# Patient Record
Sex: Female | Born: 1980 | Race: White | Hispanic: No | Marital: Married | Smoking: Never smoker
Health system: Southern US, Community
[De-identification: ages and names within clinical notes are randomized; demographics above are authoritative.]

## PROBLEM LIST (undated history)

## (undated) DIAGNOSIS — R002 Palpitations: Principal | ICD-10-CM

## (undated) HISTORY — PX: CHOLECYSTECTOMY: SHX55

## (undated) HISTORY — DX: Palpitations: R00.2

---

## 2008-01-30 ENCOUNTER — Encounter: Admission: RE | Admit: 2008-01-30 | Discharge: 2008-01-30 | Payer: Self-pay | Admitting: Family Medicine

## 2008-02-04 ENCOUNTER — Ambulatory Visit (HOSPITAL_COMMUNITY): Admission: RE | Admit: 2008-02-04 | Discharge: 2008-02-04 | Payer: Self-pay | Admitting: Gastroenterology

## 2008-02-06 ENCOUNTER — Observation Stay (HOSPITAL_COMMUNITY): Admission: EM | Admit: 2008-02-06 | Discharge: 2008-02-08 | Payer: Self-pay | Admitting: Emergency Medicine

## 2008-03-05 ENCOUNTER — Ambulatory Visit (HOSPITAL_COMMUNITY): Admission: RE | Admit: 2008-03-05 | Discharge: 2008-03-05 | Payer: Self-pay | Admitting: General Surgery

## 2008-03-05 ENCOUNTER — Encounter (HOSPITAL_BASED_OUTPATIENT_CLINIC_OR_DEPARTMENT_OTHER): Payer: Self-pay | Admitting: General Surgery

## 2011-01-24 NOTE — Op Note (Signed)
NAMEJEMIMAH, Peggy Medina             ACCOUNT NO.:  [id number]   MEDICAL RECORD NO.:  [id number]          PATIENT TYPE:  INP   LOCATION:  3737                         FACILITY:  [facility]   PHYSICIAN:  Bernette Redbird, M.D.   DATE OF BIRTH:  22-Aug-1981   DATE OF PROCEDURE:  02/06/2008  DATE OF DISCHARGE:                               OPERATIVE REPORT   PROCEDURE:  Upper endoscopy with control of hemorrhage.   INDICATION:  This is a 30 year old female 2 days status post uneventful  sphincterotomy with removal of common duct stone, done in anticipation  of upcoming laparoscopic cholecystectomy.  The patient had elevated  liver chemistries preoperatively, and they have improved substantially  today.   The patient felt a lot better after a sphincterotomy and did well until  today when she started having hematochezia and orthostatic symptoms.  In  the emergency room, her hemoglobin was approximately 10.3 and has fallen  to 9.3 with hydration, and she passed out walking back from the  bathroom.   FINDINGS:  Oozing from sphincterotomy site with complete hemostasis  after epinephrine injection.   PROCEDURE:  The patient provided written consent for the procedure.  Sedation with Versed 15 mg IV (no fentanyl, in view of borderline blood  pressure and orthostasis).  The Pentax video duodenoscope was passed  bluntly into the esophagus and advanced into the stomach, which had a  grossly normal appearance in all areas.  No blood or coffee-ground  material were present in the stomach.   The duodenum was entered without too much difficulty.  It was noted to  contain a fair amount of amber bile.   I went back-and-forth attempting to find the sphincterotomy site, which  was not immediately apparent and during pullback, I saw a little bit of  blood trickling from the duodenal wall, and on closer inspection, this  corresponded to the sphincterotomy site as confirmed by a small amount  of eschar present  at the apex of the cut.  With rinsing, it appeared  that blood was coming both from the margin of the cut and also from the  base of the cut on its inferior margin.  A sclerotherapy needle with  1:10,000 epinephrine was used to inject this area with a total of 2 mL  in approximately 3 injections.  This led to good blanching of the  mucosa.   It should be noted the patient also received glucagon 1 mg IV during the  procedure to reduce duodenal contractility.   Rinsing with water disclosed no evidence of ongoing bleeding, so the  scope was removed from the patient, who tolerated the procedure well,  and there were no apparent complications.   IMPRESSION:  1. Active oozing at the sphincterotomy site, controlled with      epinephrine injection as described above.  2. Hematochezia with posthemorrhagic anemia and mild-to-moderate      hemodynamic instability, secondary to active oozing at the      sphincterotomy site.   PLAN:  Supportive care with close observation and empiric antipeptic  therapy.  The patient might be a candidate for repeat  endoscopy in the  event of further bleeding.           ______________________________  Bernette Redbird, M.D.     RB/MEDQ  D:  02/06/2008  T:  02/07/2008  Job:  [id number]   cc:   Gretta Arab. Valentina Lucks, M.D.  [facility]

## 2011-01-24 NOTE — Op Note (Signed)
Peggy Medina, Peggy Medina             ACCOUNT NO.:  [id number]   MEDICAL RECORD NO.:  [id number]          PATIENT TYPE:  AMB   LOCATION:  ENDO                         FACILITY:  [facility]   PHYSICIAN:  Petra Kuba, M.D.    DATE OF BIRTH:  Jul 29, 1981   DATE OF PROCEDURE:  02/04/2008  DATE OF DISCHARGE:                               OPERATIVE REPORT   PROCEDURES:  Endoscopic retrograde cholangiopancreatography,  sphincterotomy and stone extraction.   INDICATION:  The patient with probable CBD stones, pain, dilated CBD,  elevated liver tests, and known gallstones.  Consent was signed after  risks, benefits, methods, and options thoroughly discussed in the office  last week and today prior to procedure.   MEDICINES USED:  1. Fentanyl 150 mcg.  2. Versed 10 mg.   PROCEDURE:  Side-viewing therapeutic video duodenoscope was inserted by  indirect vision into the stomach and advanced through normal antrum and  pylorus into the duodenal bulb and around the C-loop.  A normal-  appearing ampulla was brought into view and using the triple-lumen  sphincterotome, deep selective cannulation was obtained using the wire.  There were no PD injections throughout the procedure.  We had difficulty  keeping dye in the CBD.  The intrahepatic filled readily, left greater  than right.  We went ahead and proceeded based on  thinking we saw a  stone on some of the injections floating freely in the CBD with a medium-  sized sphincterotomy until we had excellent biliary drainage and we were  able to get completely the sphincterotome in and out of the duct.  We  then went ahead and exchanged the sphincterotome for the adjustable 12-  to 15-mm balloon.  Upon the initial pull-through, there was no  resistance.  The stone seemed to slide around the balloon, so we went  ahead and inflated the balloon to 15 mm prior to the duct, lowered the  balloon to 12 mm, and the balloon and the stone were delivered.  We went  ahead and proceeded with two more balloon pull-throughs, one with  lowering the balloon as we did before and the other with pulling the  balloon through 15 mm size with only minimal resistance.  We then  proceeded with an occlusion cholangiogram which showed normal  intrahepatic but again had trouble keeping contrast in the CBD.  There  seemed to still be too much air in the system.  There was no cystic duct  filling throughout the procedure and withdrawing the balloon.  After the  occlusion cholangiogram, we did when popping the balloon and withdrawing  it one more time to 15 mm mark and elected to stop the procedure at this  junction since there was excellent biliary drainage and no obvious stone  being seen.  The scope was removed.  The patient tolerated the procedure  well.  There was no obvious immediate complication.   ENDOSCOPIC DIAGNOSES:  1. Normal ampulla.  2. No PD injections throughout the procedure.  No cystic duct filling      or gallbladder filling.  3. Dilated intrahepatic.  4. Difficult to  keep dye in the common bile duct.  5. One stone removed after moderate sphincterotomy using the      adjustable 12- to 15-mm balloon.  6. Negative obvious occlusion cholangiogram but not great distal      common bile duct pictures.  7. Multiple balloon pull-throughs without any further stones.   PLAN:  To see how she does clinically.  We will get a surgical consult  fairly soon.  No aspirin or nonsteroidals.  We will need repeat liver  tests in a few days probably when she will see the surgeon.  Very slowly  advance her diet and call p.r.n. recurrent symptoms or complications.           ______________________________  Petra Kuba, M.D.     MEM/MEDQ  D:  02/04/2008  T:  02/05/2008  Job:  [id number]   cc:   Gretta Arab. Valentina Lucks, M.D.

## 2011-01-24 NOTE — Op Note (Signed)
NAMEMELONEY, Peggy Medina             ACCOUNT NO.:  [id number]   MEDICAL RECORD NO.:  [id number]          PATIENT TYPE:  AMB   LOCATION:  SDS                          FACILITY:  [facility]   PHYSICIAN:  Leonie Man, M.D.   DATE OF BIRTH:  03-27-1981   DATE OF PROCEDURE:  03/05/2008  DATE OF DISCHARGE:  03/05/2008                               OPERATIVE REPORT   PREOPERATIVE DIAGNOSIS:  Chronic calculous cholecystitis.   POSTOPERATIVE DIAGNOSIS:  Chronic calculous cholecystitis.   PROCEDURE:  Laparoscopic cholecystectomy with intraoperative  cholangiogram.   SURGEON:  Dorisann Frames, MD   ASSISTANT:  Jerkin.   ANESTHESIA:  General.   SPECIMENS TO LAB:  Gallbladder with stones.   FINDINGS:  Chronically scarred gallbladder, normal common bile duct,  hepatic duct, and upper hepatic radicals.   The patient is a 30 year old female presenting originally with severe  epigastric and right upper quadrant pain, on ultrasound noted to have  choledocholithiasis.  She also had an elevated bilirubin and elevated  liver function studies and she underwent ERCP.  She subsequently now  comes to operation for laparoscopic cholecystectomy and intraoperative  cholangiogram.   The patient was positioned supinely and following induction of  satisfactory general anesthesia, the abdomen is prepped and draped to be  included in a sterile operative field.  Positive identification of the  patient as Peggy Medina and the operation will be done as  laparoscopic cholecystectomy carried out.  Routine time-out was  completely unremarkable.   Open laparoscopy was then created at the umbilicus with insertion of a  Hasson cannula and insufflation of the peritoneal cavity to 14 mmHg  pressure using carbon dioxide.  The camera was inserted and visual  exploration of the abdomen then carried out with the gallbladder now  being seem to be quite chronically scarred with a few adhesions of the  wall of the  gallbladder.  Gallbladder wall was thickened.  The liver  edges were sharp and liver surfaces were smooth.  None of the small or  large intestine was visualized, appeared to be abnormal.   Under direct vision, epigastric and lateral ports were placed.  The  gallbladder was grasped and retracted cephalad, and dissection carried  down to the region of the ampulla where the cystic duct was mobilized.  The cystic duct was somewhat thickened with scar and this was  skeletonized down through a still a large cystic duct.  Cystic artery  was seen, doubly clipped and transected.  The cystic duct cholangiogram  was carried out by passing a Cook catheter transcutaneously into the  abdomen and the cystic duct.  Under fluoroscopic control, a one-half  strength Hypaque was injected into the extrahepatic biliary system with  resulting cholangiogram showing a normal hepatic ducts  and normal  common bile duct shows a prompt flow of contrast into the duodenum.   The cystic duct was then triply clipped and transection and the  gallbladder was dissected free from the liver bed using electrocautery  and hemostasis was maintained throughout the entire course of  dissection.  At the end of dissection, the gallbladder was placed  in an  EndoCatch and removed from the operative field without difficulty.  All  the areas of dissection checked for hemostasis and noted to be dry.  Sponge and instrument counts were verified.  The lateral ports were  removed under direct vision and the umbilical port closed under direct  vision in two layers using 0-Vicryl and 4-0 Monocryl.  Pneumoperitoneum  was allowed to deflate and the flank and epigastric wounds closed with  running 4-0 Monocryl sutures and reinforced with Dermabond.  Anesthetic  reversed.  The patient was removed from the operating room to the  recovery room in stable condition.  She tolerated the procedure well.      Leonie Man, M.D.  Electronically  Signed     PB/MEDQ  D:  03/05/2008  T:  03/06/2008  Job:  [id number]

## 2011-01-27 NOTE — H&P (Signed)
NAMEGILA, Peggy Medina             ACCOUNT NO.:  [id number]   MEDICAL RECORD NO.:  [id number]          PATIENT TYPE:  INP   LOCATION:  3737                         FACILITY:  [facility]   PHYSICIAN:  Bernette Redbird, M.D.   DATE OF BIRTH:  September 16, 1980   DATE OF ADMISSION:  02/06/2008  DATE OF DISCHARGE:  02/08/2008                              HISTORY & PHYSICAL   HISTORY OF PRESENT ILLNESS:  Peggy Medina is a 30 year old female who on  Feb 04, 2008, underwent ERCP with Dr. Vida Rigger for  choledocholithiasis.  The patient felt fine, yesterday being Feb 05, 2008.  She saw Dr. Leonie Man  with [facility] for an  office visit and scheduled a laparoscopic cholecystectomy in the coming  weeks.  Afterwards, she began having dark bowel movements overnight and  first thing in the morning on Feb 06, 2008, she had 2 more dark bowel  movements, the liquid in the last bowel movement was tinged with red  blood.  The patient describes passing out on the bathroom floor.  She is  dizzy when standing.  She denies any abdominal pain, vomiting, or fever.  Her primary care physician is Dr.Elaine Valentina Lucks.   PAST MEDICAL HISTORY:  Significant for no chronic medical illnesses.  She has a history of benign lumpectomy.  Possible history of ulcer  disease at age 23.   CURRENT MEDICATIONS:  None.   ALLERGIES TO MEDICATIONS:  None.   REVIEW OF SYSTEMS:  Significant for dark urine and abdominal pain, which  were both relieved after her ERCP on Feb 04, 2008.   SOCIAL HISTORY:  Significant for occasional alcohol but no tobacco.  She  lives with her boyfriend who is very supportive.   FAMILY HISTORY:  Negative for gallbladder disease.   PHYSICAL EXAMINATION:  GENERAL:  She is alert and oriented.  Shows no  signs of jaundice.  VITAL SIGNS:  Her temperature is 97.7, pulse is 118, respirations are  20.  HEART:  Regular rate and rhythm.  LUNGS:  Clear to auscultation bilaterally.  ABDOMEN:  Soft,  nontender, and nondistended with good bowel sounds.  NEUROLOGIC:  She does appear to get very dizzy when she sits up or  stands up.   LABORATORY DATA:  Labs on Feb 04, 2008, prior to her ERCP showed AST of  449, ALT of 670, alk phos 230, and total bilirubin 6.3.  Hemoglobin in  the emergency room today is 10.4, hematocrit is 30.7, White count 9.5,  and platelets 232,000.  Her BUN is 26, creatinine 0.8, and glucose 154.  An ultrasound done on Jan 30, 2008, showed,  1. Obstructive biliary tract with choledocholithiasis.  2. Gallbladder sludge.  3. No gallbladder wall thickening.   ASSESSMENT:  Dr. Molly Maduro Buccini has seen and examined the patient's  history and reviewed her chart.  He reports the patient denies pain.  LFTs significantly improved.  Hemoglobin is dropping and have started  second IV and prepared to give blood, especially of EGD shows active  bleeding, which cannot be controlled.      Stephani Police, PA  ______________________________  Bernette Redbird, M.D.    MLY/MEDQ  D:  02/10/2008  T:  02/11/2008  Job:  [id number]   cc:   Gretta Arab. Valentina Lucks, M.D.

## 2011-01-27 NOTE — Discharge Summary (Signed)
Peggy Medina, Peggy Medina             ACCOUNT NO.:  [id number]   MEDICAL RECORD NO.:  [id number]          PATIENT TYPE:  INP   LOCATION:  3737                         FACILITY:  [facility]   PHYSICIAN:  Stephani Police, PA    DATE OF BIRTH:  1981/02/09   DATE OF ADMISSION:  02/06/2008  DATE OF DISCHARGE:  02/08/2008                               DISCHARGE SUMMARY   DISCHARGE DIAGNOSES:  1. Gastrointestinal bleed status post endoscopic retrograde      cholangiopancreatography with sphincterotomy.  2. Acute blood loss anemia.  3. Cholelithiasis.   PROCEDURES:  Upper endoscopy on Feb 06, 2008, by Dr. Bernette Medina, the  result of which showed active oozing at the sphincterotomy site.  Bleeding was controlled with epinephrine injection.   CONSULTANTS:  None.   RADIOLOGICAL EXAMS:  None   BRIEF HISTORY AND PHYSICAL:  This is a very pleasant 30 year old female  who was recently diagnosed with choledocholithiasis, but became  symptomatic.  Her LFTs became elevated.  She was very jaundiced and had  dark urine.  On Feb 04, 2008, Dr. Petra Medina did a successful ERCP  with sphincterotomy and stone removal.  The patient tells me that she  felt immediately better on Feb 05, 2008, and her jaundice decreased.  Her urine was no longer dark and she was not having any abdominal pain.  She came to the hospital on Feb 06, 2008, after having multiple black  bowel movements and feeling extremely dizzy.  She states that she passed  out on the bathroom floor.  On my initial exam, she was alert and  oriented, slightly pale.  Her abdomen was soft, nontender, and  nondistended with good bowel sounds.  When she sat up, she did become  dizzy.  Orthostatics were positive.  It was decided that she needed an  upper endoscopy as well as admission for observation.   HOSPITAL COURSE:  The patient was given a 1000 mL bolus of normal saline  fluids.  She underwent upper endoscopy by Dr. Bernette Medina.  Results  were listed above.  She was admitted under observation status and given  2 units of packed red blood cells on Feb 07, 2008.  Dr. Matthias Medina noted  that she had was hemodynamically stable.  She had no further dizziness.  On Feb 08, 2008, she was ready for discharge.  Her hemoglobin was stable  at 9.3.  She was able to tolerate her diet.  She was able to ambulate  around the room without any dizziness.   LABS AT DISCHARGE:  Significant for a BMET that was within normal  limits, specifically a BUN of 2 and creatinine 0.76 and a CBC that  showed a hemoglobin of 9.3, hematocrit 29.5, white count 6.0, and  platelets 164,000.  As a note of followup to the elevated LFTs from her  choledocholithiasis, her LFTs on Feb 07, 2008, were AST 248, ALT 455,  alkaline phosphatase 108, total bilirubin 1.3.  the patient was  discharged to home in good condition.   DISCHARGE MEDICATIONS:  Included:  1. Tylenol 325 mg q.8 h. p.r.n. pain.  2. Phenergan 25 mg p.o. q.8 h. p.r.n. nausea.   FOLLOWUP INSTRUCTIONS:  Included:  1. No NSAIDs or aspirin.  2. Please present to the [facility] GI office on February 10, 2008, for lab work      inclusive of CBC.  Also, she was given a followup appointment with      Dr. Vida Medina in 1-2 weeks.  Also, she had a previous scheduled      appointment with her primary care physician, Dr. Maurice Medina,      which she will keep.      Stephani Police, PA     MLY/MEDQ  D:  02/10/2008  T:  02/11/2008  Job:  [id number]   cc:   Peggy Medina, M.D.  Peggy Medina, M.D.  Peggy Medina, M.D.

## 2011-06-07 LAB — CBC
HCT: 20.2 — ABNORMAL LOW
HCT: 30 — ABNORMAL LOW
HCT: 30.7 — ABNORMAL LOW
Hemoglobin: 10.1 — ABNORMAL LOW
Hemoglobin: 10.6 — ABNORMAL LOW
Hemoglobin: 7 — CL
Hemoglobin: 8.4 — ABNORMAL LOW
Hemoglobin: 9.3 — ABNORMAL LOW
MCHC: 31.5
MCHC: 33.8
MCHC: 34.2
MCHC: 34.2
MCV: 84.9
MCV: 85.1
MCV: 85.3
MCV: 86.5
MCV: 86.6
MCV: 87.7
Platelets: 155
Platelets: 163
Platelets: 232
RBC: 2.87 — ABNORMAL LOW
RBC: 3.2 — ABNORMAL LOW
RBC: 3.52 — ABNORMAL LOW
RDW: 12.8
RDW: 13.1
RDW: 13.6
RDW: 14.1
WBC: 10.7 — ABNORMAL HIGH
WBC: 6.6
WBC: 7.2
WBC: 9.5

## 2011-06-07 LAB — HEPATIC FUNCTION PANEL
ALT: 670 — ABNORMAL HIGH
AST: 449 — ABNORMAL HIGH
Albumin: 4
Alkaline Phosphatase: 230 — ABNORMAL HIGH
Total Bilirubin: 6.3 — ABNORMAL HIGH

## 2011-06-07 LAB — COMPREHENSIVE METABOLIC PANEL
ALT: 455 — ABNORMAL HIGH
Albumin: 3.3 — ABNORMAL LOW
BUN: 23
Calcium: 8.3 — ABNORMAL LOW
Chloride: 106
Creatinine, Ser: 0.67
Creatinine, Ser: 0.83
GFR calc non Af Amer: >60
Glucose, Bld: 108 — ABNORMAL HIGH
Sodium: 140
Total Bilirubin: 1
Total Protein: 5.1 — ABNORMAL LOW

## 2011-06-07 LAB — POCT I-STAT, CHEM 8
HCT: 32 — ABNORMAL LOW
Hemoglobin: 10.9 — ABNORMAL LOW
Potassium: 4.9
Sodium: 138

## 2011-06-07 LAB — DIFFERENTIAL
Basophils Absolute: 0
Lymphocytes Relative: 11 — ABNORMAL LOW
Monocytes Absolute: 0.5
Neutro Abs: 7.9 — ABNORMAL HIGH

## 2011-06-07 LAB — TYPE AND SCREEN: ABO/RH(D): O POS

## 2011-06-07 LAB — BASIC METABOLIC PANEL
BUN: 2 — ABNORMAL LOW
BUN: 2 — ABNORMAL LOW
CO2: 23
CO2: 26
Calcium: 8.7
Chloride: 109
Chloride: 110
Creatinine, Ser: 0.75
Creatinine, Ser: 0.76
Glucose, Bld: 110 — ABNORMAL HIGH
Glucose, Bld: 91
Potassium: 3.2 — ABNORMAL LOW

## 2011-06-08 LAB — CBC
MCHC: 35
MCV: 84.9
Platelets: 223
RBC: 4.08
WBC: 5.4

## 2017-11-06 ENCOUNTER — Encounter (HOSPITAL_COMMUNITY): Payer: Self-pay | Admitting: Emergency Medicine

## 2017-11-06 ENCOUNTER — Other Ambulatory Visit: Payer: Self-pay

## 2017-11-06 ENCOUNTER — Ambulatory Visit (HOSPITAL_COMMUNITY)
Admission: EM | Admit: 2017-11-06 | Discharge: 2017-11-06 | Disposition: A | Discharge status: Home / Self Care | Payer: Managed Care, Other (non HMO) | Attending: Family Medicine | Admitting: Family Medicine

## 2017-11-06 DIAGNOSIS — L309 Dermatitis, unspecified: Secondary | ICD-10-CM

## 2017-11-06 MED ORDER — PREDNISONE 10 MG (48) PO TBPK: ORAL_TABLET | ORAL | 0 refills | Status: DC

## 2017-11-06 NOTE — ED Triage Notes (Addendum)
Pt reports a rash on her forearms, bilaterally that started last Thursday.  She also reports what looks like bug bites on the front of her legs bilaterally.

## 2017-11-06 NOTE — ED Provider Notes (Signed)
  [facility]   [id number] 11/06/17 Arrival Time: 1013  ASSESSMENT & PLAN:  1. Dermatitis     Meds ordered this encounter  Medications  . predniSONE (STERAPRED UNI-PAK 48 TAB) 10 MG (48) TBPK tablet    Sig: Take as directed.    Dispense:  48 tablet    Refill:  0   Benadryl if needed. Will f/u if not seeing significant improvement over the next 24-48 hours.  Reviewed expectations re: course of current medical issues. Questions answered. Outlined signs and symptoms indicating need for more acute intervention. Patient verbalized understanding. After Visit Summary given.   SUBJECTIVE:  Peggy Medina is a 37 y.o. female who presents with complaint of:   Rash Patient presents for evaluation of a rash involving her upper extremities. Fairly abrupt onset 5-6 days ago. Much itching. Reports h/o "sensitive skin." No new exposures. No trigger identified. OTC cream without relief. Afebrile. Feels itching is getting worse. Hot shower exacerbates.  ROS: As per HPI.  OBJECTIVE: Vitals:   11/06/17 1057  BP: (!) 144/92  Pulse: 77  Temp: 98.7 F (37.1 C)  TempSrc: Oral  SpO2: 96%    General appearance: alert; no distress Lungs: clear to auscultation bilaterally Heart: regular rate and rhythm Extremities: no edema Skin: warm and dry; bilateral flexor surface skin erythema with linear and confluent areas; few areas of confluent vesicles; overall appears rhus-like Psychological: alert and cooperative; normal mood and affect  No Known Allergies   Social History   Socioeconomic History  . Marital status: Single    Spouse name: Not on file  . Number of children: Not on file  . Years of education: Not on file  . Highest education level: Not on file  Social Needs  . Financial resource strain: Not on file  . Food insecurity - worry: Not on file  . Food insecurity - inability: Not on file  . Transportation needs - medical: Not on file  . Transportation needs -  non-medical: Not on file  Occupational History  . Not on file  Tobacco Use  . Smoking status: Never Smoker  . Smokeless tobacco: Never Used  Substance and Sexual Activity  . Alcohol use: Yes  . Drug use: No  . Sexual activity: Not on file  Other Topics Concern  . Not on file  Social History Narrative  . Not on file    Past Surgical History:  Procedure Laterality Date  . Barrington EllisonHOLECYSTECTOMY       Jacoya Bauman, MD 11/06/17 1302

## 2017-11-28 ENCOUNTER — Other Ambulatory Visit: Payer: Self-pay

## 2017-11-28 ENCOUNTER — Ambulatory Visit (HOSPITAL_COMMUNITY)
Admission: EM | Admit: 2017-11-28 | Discharge: 2017-11-28 | Disposition: A | Discharge status: Home / Self Care | Payer: Managed Care, Other (non HMO) | Attending: Family Medicine | Admitting: Family Medicine

## 2017-11-28 ENCOUNTER — Encounter (HOSPITAL_COMMUNITY): Payer: Self-pay | Admitting: Emergency Medicine

## 2017-11-28 DIAGNOSIS — L309 Dermatitis, unspecified: Secondary | ICD-10-CM | POA: Diagnosis not present

## 2017-11-28 MED ORDER — BETAMETHASONE DIPROPIONATE 0.05 % EX OINT: TOPICAL_OINTMENT | CUTANEOUS | 0 refills | Status: AC

## 2017-11-28 MED ORDER — TRIAMCINOLONE ACETONIDE 0.1 % EX CREA: 1.0000 "application " | TOPICAL_CREAM | Freq: Two times a day (BID) | CUTANEOUS | 0 refills | Status: DC

## 2017-11-28 MED ORDER — PREDNISONE 10 MG (21) PO TBPK: ORAL_TABLET | Freq: Every day | ORAL | 0 refills | Status: DC

## 2017-11-28 NOTE — ED Triage Notes (Signed)
Pt. Stated, Peggy Medina had a rash for a week, started on my arms and has spread all over.

## 2017-12-03 NOTE — ED Provider Notes (Signed)
[facility]   [id number] 11/28/17 Arrival Time: 1740  ASSESSMENT & PLAN:  1. Eczema, unspecified type     Meds ordered this encounter  Medications  . predniSONE (STERAPRED UNI-PAK 21 TAB) 10 MG (21) TBPK tablet    Sig: Take by mouth daily. Take as directed.    Dispense:  21 tablet    Refill:  0  . betamethasone dipropionate (DIPROLENE) 0.05 % ointment    Sig: Apply twice daily to hand eczema.    Dispense:  30 g    Refill:  0  . triamcinolone cream (KENALOG) 0.1 %    Sig: Apply 1 application topically 2 (two) times daily.    Dispense:  30 g    Refill:  0   Will think about scheduling dermatology appt if needed. Will follow up with PCP or here if worsening or failing to improve as anticipated. Reviewed expectations re: course of current medical issues. Questions answered. Outlined signs and symptoms indicating need for more acute intervention. Patient verbalized understanding. After Visit Summary given.   SUBJECTIVE:  Peggy Medina is a 37 y.o. female who presents with a skin complaint.   Location: forearms; "same as last visit"; resolved completely after taking prednisone Onset: gradual Duration: 1 week Pruritic? Yes Painful? Yes Progression: increasing steadily  Drainage? No  Known trigger? No  New soaps/lotions/topicals/detergents? No Environmental exposures or allergies? none Contacts with similar? No Recent travel? No  Other associated symptoms: none Therapies tried thus far: OTC hydrocortisone without relief Denies fever. No specific aggravating or alleviating factors reported.  ROS: As per HPI.  OBJECTIVE: Vitals:   11/28/17 1902 11/28/17 1904  BP: 132/88   Pulse: 79   Resp: 17   Temp: 98.4 F (36.9 C)   TempSrc: Oral   SpO2: 100%   Weight:  154 lb (69.9 kg)  Height:  5\' 8"  (1.727 m)    General appearance: alert; no distress Lungs: clear to auscultation bilaterally Heart: regular rate and rhythm Extremities: no edema Skin:  warm and dry; bilateral flexor surface skin erythema with linear and confluent areas; few areas of confluent vesicles; appears the same as on her last visit Psychological: alert and cooperative; normal mood and affect  No Known Allergies   Social History   Socioeconomic History  . Marital status: Single    Spouse name: Not on file  . Number of children: Not on file  . Years of education: Not on file  . Highest education level: Not on file  Occupational History  . Not on file  Social Needs  . Financial resource strain: Not on file  . Food insecurity:    Worry: Not on file    Inability: Not on file  . Transportation needs:    Medical: Not on file    Non-medical: Not on file  Tobacco Use  . Smoking status: Never Smoker  . Smokeless tobacco: Never Used  Substance and Sexual Activity  . Alcohol use: Yes  . Drug use: No  . Sexual activity: Not on file  Lifestyle  . Physical activity:    Days per week: Not on file    Minutes per session: Not on file  . Stress: Not on file  Relationships  . Social connections:    Talks on phone: Not on file    Gets together: Not on file    Attends religious service: Not on file    Active member of club or organization: Not on file    Attends meetings of  clubs or organizations: Not on file    Relationship status: Not on file  . Intimate partner violence:    Fear of current or ex partner: Not on file    Emotionally abused: Not on file    Physically abused: Not on file    Forced sexual activity: Not on file  Other Topics Concern  . Not on file  Social History Narrative  . Not on file   No family history on file. Past Surgical History:  Procedure Laterality Date  . Barrington Ellison, MD 12/03/17 [id number]

## 2018-01-14 ENCOUNTER — Ambulatory Visit (HOSPITAL_COMMUNITY)
Admission: EM | Admit: 2018-01-14 | Discharge: 2018-01-14 | Disposition: A | Discharge status: Home / Self Care | Payer: Managed Care, Other (non HMO) | Attending: Family Medicine | Admitting: Family Medicine

## 2018-01-14 ENCOUNTER — Ambulatory Visit (INDEPENDENT_AMBULATORY_CARE_PROVIDER_SITE_OTHER): Payer: Managed Care, Other (non HMO)

## 2018-01-14 ENCOUNTER — Encounter (HOSPITAL_COMMUNITY): Payer: Self-pay | Admitting: Family Medicine

## 2018-01-14 DIAGNOSIS — M7741 Metatarsalgia, right foot: Secondary | ICD-10-CM | POA: Diagnosis not present

## 2018-01-14 MED ORDER — NAPROXEN 500 MG PO TABS: 500.0000 mg | ORAL_TABLET | Freq: Two times a day (BID) | ORAL | 0 refills | Status: DC

## 2018-01-14 NOTE — Discharge Instructions (Addendum)
Your xray does not show any signs of bony injury.  This swelling may be related to spray or irritation to tendons/ligaments. Continue to wear shoe with strong supportive sole. Naproxen twice a day, take with food. Ice and elevation after increased activity.  May follow up with podiatry and/or orthopedics if symptoms persist or do not improve.

## 2018-01-14 NOTE — ED Triage Notes (Signed)
Pt here for right foot pain and swelling. sts that she also has been having pain in right knee, medial.

## 2018-01-14 NOTE — ED Provider Notes (Signed)
[facility]    CSN: [id number] Arrival date & time: 01/14/18  1258     History   Chief Complaint Chief Complaint  Patient presents with  . Foot Pain    HPI Peggy Medina is a 37 y.o. female.   Peggy Medina presents with complaints of right dorsal foot pain which started 5/1 when she woke. She noticed she had the pain when she put her shoe on and has since had swelling to her foot. Pain only if dorsal foot is touched such as with a tennis shoe. She is wearing a flat shoe which is open to the top of foot which does not cause pain. No pain with ambulation. Two days ago she also noted right medial knee pain, which is only present if it is pressed. No pain with weight bearing or ROM of knee. No known injury to foot or knee. Has not taken any medications for pain. Denies any previous similar. Denies any current pain at rest. Pain 3/10 if either knee or foot is "touched." Without contributing medical history.     ROS per HPI.      History reviewed. No pertinent past medical history.  There are no active problems to display for this patient.   Past Surgical History:  Procedure Laterality Date  . CHOLECYSTECTOMY      OB History   None      Home Medications    Prior to Admission medications   Medication Sig Start Date End Date Taking? Authorizing Provider  betamethasone dipropionate (DIPROLENE) 0.05 % ointment Apply twice daily to hand eczema. 11/28/17   Mardella Layman, MD  naproxen (NAPROSYN) 500 MG tablet Take 1 tablet (500 mg total) by mouth 2 (two) times daily. 01/14/18   Georgetta Haber, NP  predniSONE (STERAPRED UNI-PAK 21 TAB) 10 MG (21) TBPK tablet Take by mouth daily. Take as directed. 11/28/17   Mardella Layman, MD  triamcinolone cream (KENALOG) 0.1 % Apply 1 application topically 2 (two) times daily. 11/28/17   Mardella Layman, MD    Family History History reviewed. No pertinent family history.  Social History Social History   Tobacco Use  . Smoking  status: Never Smoker  . Smokeless tobacco: Never Used  Substance Use Topics  . Alcohol use: Yes  . Drug use: No     Allergies   Patient has no known allergies.   Review of Systems Review of Systems   Physical Exam Triage Vital Signs ED Triage Vitals [01/14/18 1352]  Enc Vitals Group     BP      Pulse      Resp      Temp      Temp src      SpO2      Weight      Height      Head Circumference      Peak Flow      Pain Score 3     Pain Loc      Pain Edu?      Excl. in GC?    No data found.  Updated Vital Signs LMP 12/31/2017   Visual Acuity Right Eye Distance:   Left Eye Distance:   Bilateral Distance:    Right Eye Near:   Left Eye Near:    Bilateral Near:     Physical Exam  Constitutional: She is oriented to person, place, and time. She appears well-developed and well-nourished. No distress.  Cardiovascular: Normal rate, regular rhythm and normal heart sounds.  Pulmonary/Chest:  Effort normal and breath sounds normal.  Musculoskeletal:       Right knee: She exhibits normal range of motion, no swelling, no effusion, no ecchymosis, no deformity, no laceration, no erythema, normal alignment, no LCL laxity, no bony tenderness, normal meniscus and no MCL laxity. Tenderness found. Medial joint line tenderness noted.       Right ankle: Normal.       Right foot: There is tenderness, bony tenderness and swelling. There is normal range of motion, normal capillary refill, no crepitus, no deformity and no laceration.       Feet:  Tenderness with swelling to distal 2-4th metatarsals; full ROM to toes and ankle; sensation intact; without pain to sole of foot, heel or ankle; strong pedal pulse, cap refill < 2 sec; right knee with full ROM; without tenderness with medial knee stress, negative anterior drawer, without pain with flexion or extension; no swelling; mild tenderness to soft tissue to medial knee   Neurological: She is alert and oriented to person, place, and time.    Skin: Skin is warm and dry.     UC Treatments / Results  Labs (all labs ordered are listed, but only abnormal results are displayed) Labs Reviewed - No data to display  EKG None  Radiology Dg Foot Complete Right  Result Date: 01/14/2018 CLINICAL DATA:  Pain and swelling to dorsal RIGHT foot for 6 days, no known injury EXAM: RIGHT FOOT COMPLETE - 3+ VIEW COMPARISON:  None FINDINGS: Osseous mineralization normal. Joint spaces preserved. No acute fracture, dislocation, or bone destruction. Dorsal soft tissue swelling of the RIGHT foot overlying the distal metatarsals. IMPRESSION: Dorsal soft tissue swelling without acute bony abnormalities. Electronically Signed   By: Ulyses Southward M.D.   On: 01/14/2018 15:12    Procedures Procedures (including critical care time)  Medications Ordered in UC Medications - No data to display  Initial Impression / Assessment and Plan / UC Course  I have reviewed the triage vital signs and the nursing notes.  Pertinent labs & imaging results that were available during my care of the patient were reviewed by me and considered in my medical decision making (see chart for details).     Xray negative at this time, without acute injury. Tendonitis discussed as differential with patient. Ice, elevation, supportive shoe, nsaid for pain control. Follow with ortho and/or podiatry for persistent symptoms. Patient verbalized understanding and agreeable to plan.  Ambulatory out of clinic without difficulty.     Final Clinical Impressions(s) / UC Diagnoses   Final diagnoses:  Metatarsalgia of right foot     Discharge Instructions     Your xray does not show any signs of bony injury.  This swelling may be related to spray or irritation to tendons/ligaments. Continue to wear shoe with strong supportive sole. Naproxen twice a day, take with food. Ice and elevation after increased activity.  May follow up with podiatry and/or orthopedics if symptoms persist or  do not improve.     ED Prescriptions    Medication Sig Dispense Auth. Provider   naproxen (NAPROSYN) 500 MG tablet Take 1 tablet (500 mg total) by mouth 2 (two) times daily. 30 tablet Georgetta Haber, NP     Controlled Substance Prescriptions [place] Controlled Substance Registry consulted? Not Applicable   Georgetta Haber, NP 01/14/18 1523

## 2018-01-28 ENCOUNTER — Ambulatory Visit (HOSPITAL_COMMUNITY)
Admission: EM | Admit: 2018-01-28 | Discharge: 2018-01-28 | Disposition: A | Discharge status: Home / Self Care | Payer: Managed Care, Other (non HMO) | Attending: Internal Medicine | Admitting: Internal Medicine

## 2018-01-28 ENCOUNTER — Encounter (HOSPITAL_COMMUNITY): Payer: Self-pay | Admitting: Emergency Medicine

## 2018-01-28 DIAGNOSIS — Z79899 Other long term (current) drug therapy: Secondary | ICD-10-CM | POA: Diagnosis not present

## 2018-01-28 DIAGNOSIS — M7989 Other specified soft tissue disorders: Secondary | ICD-10-CM | POA: Insufficient documentation

## 2018-01-28 DIAGNOSIS — R140 Abdominal distension (gaseous): Secondary | ICD-10-CM | POA: Insufficient documentation

## 2018-01-28 DIAGNOSIS — R1013 Epigastric pain: Secondary | ICD-10-CM | POA: Diagnosis not present

## 2018-01-28 LAB — CBC WITH DIFFERENTIAL/PLATELET
Abs Immature Granulocytes: 0 10*3/uL (ref 0.0–0.1)
BASOS ABS: 0 10*3/uL (ref 0.0–0.1)
Basophils Relative: 0 %
Eosinophils Absolute: 0.1 10*3/uL (ref 0.0–0.7)
Eosinophils Relative: 2 %
HEMATOCRIT: 38.8 % (ref 36.0–46.0)
HEMOGLOBIN: 13.1 g/dL (ref 12.0–15.0)
IMMATURE GRANULOCYTES: 0 %
LYMPHS ABS: 1.8 10*3/uL (ref 0.7–4.0)
LYMPHS PCT: 23 %
MCH: 28.7 pg (ref 26.0–34.0)
MCHC: 33.8 g/dL (ref 30.0–36.0)
MCV: 84.9 fL (ref 78.0–100.0)
Monocytes Absolute: 0.6 10*3/uL (ref 0.1–1.0)
Monocytes Relative: 8 %
NEUTROS ABS: 5.3 10*3/uL (ref 1.7–7.7)
NEUTROS PCT: 67 %
Platelets: 306 10*3/uL (ref 150–400)
RBC: 4.57 MIL/uL (ref 3.87–5.11)
RDW: 12.5 % (ref 11.5–15.5)
WBC: 7.9 10*3/uL (ref 4.0–10.5)

## 2018-01-28 LAB — COMPREHENSIVE METABOLIC PANEL
ALBUMIN: 4.1 g/dL (ref 3.5–5.0)
ALK PHOS: 57 U/L (ref 38–126)
ALT: 33 U/L (ref 14–54)
ANION GAP: 9 (ref 5–15)
AST: 27 U/L (ref 15–41)
BILIRUBIN TOTAL: 0.7 mg/dL (ref 0.3–1.2)
BUN: 8 mg/dL (ref 6–20)
CALCIUM: 9.2 mg/dL (ref 8.9–10.3)
CO2: 22 mmol/L (ref 22–32)
Chloride: 108 mmol/L (ref 101–111)
Creatinine, Ser: 0.72 mg/dL (ref 0.44–1.00)
GFR calc Af Amer: >60 mL/min (ref >60)
GFR calc non Af Amer: >60 mL/min (ref >60)
Glucose, Bld: 103 mg/dL — ABNORMAL HIGH (ref 65–99)
POTASSIUM: 4.4 mmol/L (ref 3.5–5.1)
SODIUM: 139 mmol/L (ref 135–145)
Total Protein: 6.9 g/dL (ref 6.5–8.1)

## 2018-01-28 MED ORDER — OMEPRAZOLE 40 MG PO CPDR: 40.0000 mg | DELAYED_RELEASE_CAPSULE | Freq: Two times a day (BID) | ORAL | 1 refills | Status: DC

## 2018-01-28 MED ORDER — TRIAMTERENE-HCTZ 37.5-25 MG PO CAPS: 1.0000 | ORAL_CAPSULE | ORAL | 0 refills | Status: DC

## 2018-01-28 NOTE — ED Provider Notes (Signed)
[facility]    CSN: [id number] Arrival date & time: 01/28/18  1951     History   Chief Complaint No chief complaint on file.   HPI Peggy Medina is a 37 y.o. female.   She presents today with persistent swelling in the right dorsal foot, was seen for that about 2 weeks ago and had a course of anti-inflammatories.  Feels like she might actually have a little bit of swelling in both distal lower extremities as well.  Not red/warm.  No fever, no malaise. Also is having at times some bloatiiness in the lower abdomen, and a sensation of heaviness in her lower chest, upper abdomen.  At times this occurs after eating.    HPI  History reviewed. No pertinent past medical history.   Past Surgical History:  Procedure Laterality Date  . CHOLECYSTECTOMY       Home Medications    Prior to Admission medications   Medication Sig Start Date End Date Taking? Authorizing Provider  betamethasone dipropionate (DIPROLENE) 0.05 % ointment Apply twice daily to hand eczema. 11/28/17   Mardella Layman, MD  naproxen (NAPROSYN) 500 MG tablet Take 1 tablet (500 mg total) by mouth 2 (two) times daily. 01/14/18   Georgetta Haber, NP  omeprazole (PRILOSEC) 40 MG capsule Take 1 capsule (40 mg total) by mouth 2 (two) times daily before a meal for 14 days. 01/28/18 02/11/18  Isa Rankin, MD  predniSONE (STERAPRED UNI-PAK 21 TAB) 10 MG (21) TBPK tablet Take by mouth daily. Take as directed. 11/28/17   Mardella Layman, MD  triamcinolone cream (KENALOG) 0.1 % Apply 1 application topically 2 (two) times daily. 11/28/17   Mardella Layman, MD  triamterene-hydrochlorothiazide (DYAZIDE) 37.5-25 MG capsule Take 1 each (1 capsule total) by mouth 2 (two) times a week. As needed for leg swelling 01/28/18   Isa Rankin, MD    Family History No family hx DVT/PE  Social History Social History   Tobacco Use  . Smoking status: Never Smoker  . Smokeless tobacco: Never Used  Substance Use Topics  .  Alcohol use: Yes  . Drug use: No     Allergies   Patient has no known allergies.   Review of Systems Review of Systems  All other systems reviewed and are negative.    Physical Exam Triage Vital Signs ED Triage Vitals  Enc Vitals Group     BP 01/28/18 2007 (!) 136/93     Pulse Rate 01/28/18 2007 87     Resp 01/28/18 2007 16     Temp 01/28/18 2007 98.4 F (36.9 C)     Temp Source 01/28/18 2007 Temporal     SpO2 01/28/18 2007 100 %     Weight 01/28/18 2005 154 lb (69.9 kg)     Height --      Pain Score 01/28/18 2005 1     Pain Loc --    Updated Vital Signs BP (!) 136/93   Pulse 87   Temp 98.4 F (36.9 C) (Temporal)   Resp 16   Wt 154 lb (69.9 kg)   LMP 12/31/2017   SpO2 100%   BMI 23.42 kg/m  Physical Exam  Constitutional: She is oriented to person, place, and time. No distress.  HENT:  Head: Atraumatic.  Eyes:  Conjugate gaze observed, no eye redness/discharge  Neck: Neck supple.  Cardiovascular: Normal rate and regular rhythm.  Pulmonary/Chest: No respiratory distress. She has no wheezes. She has no rales.  Lungs clear,  symmetric breath sounds   Abdominal: Soft. She exhibits no distension. There is no tenderness. There is no rebound and no guarding.  Musculoskeletal: Normal range of motion.  Right dorsal foot has 1+ pitting edema, with mild puffiness of the distal bilateral lower extremities, nonpitting.  Skin is intact, no erythema/warmth.  Neurological: She is alert and oriented to person, place, and time.  Skin: Skin is warm and dry.  Nursing note and vitals reviewed.    UC Treatments / Results  Labs Results for orders placed or performed during the hospital encounter of 01/28/18  CBC with Differential  Result Value Ref Range   WBC 7.9 4.0 - 10.5 K/uL   RBC 4.57 3.87 - 5.11 MIL/uL   Hemoglobin 13.1 12.0 - 15.0 g/dL   HCT 16.1 09.6 - 04.5 %   MCV 84.9 78.0 - 100.0 fL   MCH 28.7 26.0 - 34.0 pg   MCHC 33.8 30.0 - 36.0 g/dL   RDW 40.9 81.1 -  91.4 %   Platelets 306 150 - 400 K/uL   Neutrophils Relative % 67 %   Neutro Abs 5.3 1.7 - 7.7 K/uL   Lymphocytes Relative 23 %   Lymphs Abs 1.8 0.7 - 4.0 K/uL   Monocytes Relative 8 %   Monocytes Absolute 0.6 0.1 - 1.0 K/uL   Eosinophils Relative 2 %   Eosinophils Absolute 0.1 0.0 - 0.7 K/uL   Basophils Relative 0 %   Basophils Absolute 0.0 0.0 - 0.1 K/uL   Immature Granulocytes 0 %   Abs Immature Granulocytes 0.0 0.0 - 0.1 K/uL  Comprehensive metabolic panel  Result Value Ref Range   Sodium 139 135 - 145 mmol/L   Potassium 4.4 3.5 - 5.1 mmol/L   Chloride 108 101 - 111 mmol/L   CO2 22 22 - 32 mmol/L   Glucose, Bld 103 (H) 65 - 99 mg/dL   BUN 8 6 - 20 mg/dL   Creatinine, Ser 7.82 0.44 - 1.00 mg/dL   Calcium 9.2 8.9 - 95.6 mg/dL   Total Protein 6.9 6.5 - 8.1 g/dL   Albumin 4.1 3.5 - 5.0 g/dL   AST 27 15 - 41 U/L   ALT 33 14 - 54 U/L   Alkaline Phosphatase 57 38 - 126 U/L   Total Bilirubin 0.7 0.3 - 1.2 mg/dL   GFR calc non Af Amer >60 >60 mL/min   GFR calc Af Amer >60 >60 mL/min   Anion gap 9 5 - 15    EKG None  Radiology No results found.  Procedures Procedures (including critical care time) None today  Medications Ordered in UC Medications - No data to display   Final Clinical Impressions(s) / UC Diagnoses   Final diagnoses:  Leg swelling  Bloating  Epigastric discomfort     Discharge Instructions     Leg swelling can have many causes, including dietary factors, local inflammation, less likely blood clot, varicose veins.  Prescription for a mild diuretic (triamterene/HCTZ) was sent to the pharmacy to try 1-2 times weekly to see if it helps.  An ultrasound of the leg could also be done.  A prescription for omeprazole (for stomach acid) was sent to the pharmacy to see if it helps with bloating/heaviness.  Recheck or followup with a primary care provider to discuss possible next steps if symptoms persist.     ED Prescriptions    Medication Sig Dispense  Auth. Provider   triamterene-hydrochlorothiazide (DYAZIDE) 37.5-25 MG capsule Take 1 each (1 capsule total)  by mouth 2 (two) times a week. As needed for leg swelling 15 capsule Isa Rankin, MD   omeprazole (PRILOSEC) 40 MG capsule Take 1 capsule (40 mg total) by mouth 2 (two) times daily before a meal for 14 days. 28 capsule Isa Rankin, MD        Isa Rankin, MD 01/29/18 [phone]

## 2018-01-28 NOTE — Discharge Instructions (Addendum)
Leg swelling can have many causes, including dietary factors, local inflammation, less likely blood clot, varicose veins.  Prescription for a mild diuretic (triamterene/HCTZ) was sent to the pharmacy to try 1-2 times weekly to see if it helps.  An ultrasound of the leg could also be done.  A prescription for omeprazole (for stomach acid) was sent to the pharmacy to see if it helps with bloating/heaviness.  Recheck or followup with a primary care provider to discuss possible next steps if symptoms persist.

## 2018-01-28 NOTE — ED Triage Notes (Signed)
PT reports naproxen a few weeks ago for right foot swelling.  PT reports bloating / swelling across abdomen for the past few days.  "slight heaviness"  in chest for last few days. No SOB.   Loss of appetite over past 2 days.

## 2018-01-28 NOTE — ED Triage Notes (Signed)
PT denies SOB, nausea, weakness, dizziness.

## 2018-02-20 ENCOUNTER — Ambulatory Visit (HOSPITAL_COMMUNITY)
Admission: EM | Admit: 2018-02-20 | Discharge: 2018-02-20 | Disposition: A | Discharge status: Home / Self Care | Payer: Managed Care, Other (non HMO) | Attending: Urgent Care | Admitting: Urgent Care

## 2018-02-20 ENCOUNTER — Encounter (HOSPITAL_COMMUNITY): Payer: Self-pay | Admitting: Emergency Medicine

## 2018-02-20 ENCOUNTER — Other Ambulatory Visit: Payer: Self-pay

## 2018-02-20 ENCOUNTER — Ambulatory Visit (INDEPENDENT_AMBULATORY_CARE_PROVIDER_SITE_OTHER): Payer: Managed Care, Other (non HMO)

## 2018-02-20 DIAGNOSIS — M25571 Pain in right ankle and joints of right foot: Secondary | ICD-10-CM | POA: Diagnosis not present

## 2018-02-20 DIAGNOSIS — M7989 Other specified soft tissue disorders: Secondary | ICD-10-CM

## 2018-02-20 DIAGNOSIS — M79661 Pain in right lower leg: Secondary | ICD-10-CM

## 2018-02-20 DIAGNOSIS — M79671 Pain in right foot: Secondary | ICD-10-CM

## 2018-02-20 DIAGNOSIS — G8929 Other chronic pain: Secondary | ICD-10-CM

## 2018-02-20 LAB — URIC ACID: URIC ACID, SERUM: 6.5 mg/dL (ref 2.3–6.6)

## 2018-02-20 MED ORDER — FUROSEMIDE 40 MG PO TABS: 40.0000 mg | ORAL_TABLET | Freq: Every day | ORAL | 0 refills | Status: DC

## 2018-02-20 NOTE — Discharge Instructions (Addendum)
Call my primary care clinic and make an appointment with me so that we can continue working on your leg swelling, pain.  Peggy BambergMario Yafet Cline, PA-C Primary Care at [facility] [phone] [address], [place], [place] [address]  Please eat potassium rich foods to avoid low potassium from using Lasix. Nuts, such as peanuts and pistachios. Seeds, such as sunflower seeds and pumpkin seeds. Peas, lentils, and lima beans. Whole grain and bran cereals and breads. Fresh fruits and vegetables, such as apricots, avocado, bananas, cantaloupe, kiwi, oranges, tomatoes, asparagus, and potatoes. Orange juice. Tomato juice. Red meats. Yogurt.

## 2018-02-20 NOTE — ED Provider Notes (Addendum)
MRN: [id number] DOB: [birth year]  Subjective:   Peggy Medina is a 37 y.o. female presenting for persistent right lower leg swelling.  Patient was seen at our clinic twice for this already in May.  She was initially prescribed naproxen which did not help, patient was subsequently seen again was started on hydrochlorothiazide which also has not helped.  She was advised to follow-up with primary care practice to see if they could pursue an ultrasound but patient has not done so.  She has had an x-ray and blood work done both of which were negative.  Today, she reports that her leg swelling in her foot is persisting. Now she is having pain over her foot, ankle and right calf/shin in the past 4 days.  Patient works primarily in the seated position and denies any repetitive motions with her feet including running, dancing, walking.  No current facility-administered medications for this encounter.   Current Outpatient Medications:  .  betamethasone dipropionate (DIPROLENE) 0.05 % ointment, Apply twice daily to hand eczema., Disp: 30 g, Rfl: 0 .  triamcinolone cream (KENALOG) 0.1 %, Apply 1 application topically 2 (two) times daily., Disp: 30 g, Rfl: 0   No Known Allergies  History reviewed. No pertinent past medical history.   Past Surgical History:  Procedure Laterality Date  . CHOLECYSTECTOMY     Objective:   Vitals: BP 129/84 (BP Location: Left Arm)   Pulse 75   Temp 98.5 F (36.9 C) (Oral)   LMP 01/30/2018 (Approximate)   SpO2 100%   Physical Exam  Constitutional: She is oriented to person, place, and time. She appears well-developed and well-nourished.  HENT:  Mouth/Throat: Oropharynx is clear and moist.  Eyes: Right eye exhibits no discharge. Left eye exhibits no discharge. No scleral icterus.  Cardiovascular: Normal rate and intact distal pulses.  Dorsalis pedis 2+ bilaterally.  Pulmonary/Chest: Effort normal.  Musculoskeletal:       Right ankle: She exhibits swelling  (trace). She exhibits normal range of motion, no ecchymosis, no deformity, no laceration and normal pulse. No tenderness. Achilles tendon exhibits no pain, no defect and normal Thompson's test results.       Right lower leg: She exhibits no tenderness, no bony tenderness, no swelling (negative Homan sign), no edema, no deformity and no laceration.       Right foot: There is tenderness and swelling (1+ pitting edema over area depicted with associated redness and mild warmth). There is normal capillary refill, no crepitus and no deformity.       Feet:  Neurological: She is alert and oriented to person, place, and time.  Skin: Skin is warm and dry. Capillary refill takes less than 2 seconds. No rash noted. No erythema. No pallor.  Psychiatric: She has a normal mood and affect.   Dg Ankle Complete Right  Result Date: 02/20/2018 CLINICAL DATA:  Pain and swelling, swelling for over a month. EXAM: RIGHT ANKLE - COMPLETE 3+ VIEW COMPARISON:  RIGHT foot radiograph Jan 14, 2018 FINDINGS: No fracture deformity nor dislocation. The ankle mortise appears congruent and the tibiofibular syndesmosis intact. No destructive bony lesions. Mild soft tissue swelling without subcutaneous gas or radiopaque foreign bodies. IMPRESSION: Soft tissue swelling.  No acute osseous process. Electronically Signed   By: Awilda Metro M.D.   On: 02/20/2018 18:23   Assessment and Plan :   Right calf pain  Chronic pain of right ankle  Swelling of right foot  Pain in right foot  We will start patient on  Lasix for undifferentiated swelling of her foot and ankle.  Uric acid level is pending.  X-rays have only demonstrated soft tissue swelling.  Patient will follow-up with me and will consider further imaging as needed.  For now we will hold off on the ultrasound if she does not have signs of a DVT.     [name], Lummie Montijo, PA-C 02/20/18 1836

## 2018-02-20 NOTE — ED Triage Notes (Signed)
Pt here for right LE swelling and pain.  The pain in the calf started 4-5 days ago, but the swelling in the foot has been for several weeks, with little relief from the medication she was prescribed a few weeks ago.

## 2018-02-26 ENCOUNTER — Ambulatory Visit: Payer: Managed Care, Other (non HMO) | Admitting: Urgent Care

## 2018-02-26 ENCOUNTER — Encounter: Payer: Self-pay | Admitting: Urgent Care

## 2018-02-26 ENCOUNTER — Other Ambulatory Visit: Payer: Self-pay

## 2018-02-26 VITALS — BP 124/82 | HR 73 | Temp 97.7°F | Resp 16 | Ht 68.0 in | Wt 168.6 lb

## 2018-02-26 DIAGNOSIS — M25471 Effusion, right ankle: Secondary | ICD-10-CM

## 2018-02-26 DIAGNOSIS — M7989 Other specified soft tissue disorders: Secondary | ICD-10-CM

## 2018-02-26 DIAGNOSIS — M79671 Pain in right foot: Secondary | ICD-10-CM

## 2018-02-26 NOTE — Progress Notes (Signed)
    MRN: [id number] DOB: October 29, 1980  Subjective:   Peggy Medina is a 37 y.o. female presenting for persistent right foot swelling.  She has had very little pain except initially when she was first seen at [facility].  She is undergone a course of naproxen, hydrochlorothiazide and now Lasix.  Her swelling persists and has been there since 01/14/2018.  We discussed possibility of lab work and further imaging due to a foot x-ray and ankle x-ray that only showed soft tissue swelling.  She denies redness, warmth.  Denies repetitive motions including running, dancing, jogging, sports.  Peggy Medina has a current medication list which includes the following prescription(s): betamethasone dipropionate, furosemide, and triamcinolone cream. Also has No Known Allergies.  Peggy Medina denies past medical history. Also  has a past surgical history that includes Cholecystectomy.  Objective:   Vitals: BP 124/82   Pulse 73   Temp 97.7 F (36.5 C) (Oral)   Resp 16   Ht 5\' 8"  (1.727 m)   Wt 168 lb 9.6 oz (76.5 kg)   LMP 01/30/2018 (Approximate)   SpO2 97%   BMI 25.64 kg/m   Physical Exam  Constitutional: She is oriented to person, place, and time. She appears well-developed and well-nourished.  Cardiovascular: Normal rate.  Pulmonary/Chest: Effort normal.  Musculoskeletal:       Right foot: There is swelling (over area depicted). There is normal range of motion, no tenderness, no bony tenderness, normal capillary refill, no crepitus, no deformity and no laceration.       Feet:  Dorsalis pedis 2+ on right foot.  Neurological: She is alert and oriented to person, place, and time.  Skin: Skin is warm and dry.  Psychiatric: She has a normal mood and affect.   Assessment and Plan :   Swelling of right foot - Plan: Uric A+ANA+RA Qn+CRP+ASO  Pain in right foot - Plan: Uric A+ANA+RA Qn+CRP+ASO  Swelling of right ankle joint - Plan: Uric A+ANA+RA Qn+CRP+ASO  We will pursue  imaging after discussed case with Dr. Neva SeatGreene, labs pending.  Wallis BambergMario Nyazia Canevari, PA-C Primary Care at [facility] [phone] 02/26/2018  3:58 PM

## 2018-02-26 NOTE — Patient Instructions (Addendum)
For now, try using compression stockings, wear them throughout the day. Look for a pressure of about .    Edema Edema is an abnormal buildup of fluids in your bodytissues. Edema is somewhatdependent on gravity to pull the fluid to the lowest place in your body. That makes the condition more common in the legs and thighs (lower extremities). Painless swelling of the feet and ankles is common and becomes more likely as you get older. It is also common in looser tissues, like around your eyes. When the affected area is squeezed, the fluid may move out of that spot and leave a dent for a few moments. This dent is called pitting. What are the causes? There are many possible causes of edema. Eating too much salt and being on your feet or sitting for a long time can cause edema in your legs and ankles. Hot weather may make edema worse. Common medical causes of edema include:  Heart failure.  Liver disease.  Kidney disease.  Weak blood vessels in your legs.  Cancer.  An injury.  Pregnancy.  Some medications.  Obesity.  What are the signs or symptoms? Edema is usually painless.Your skin may look swollen or shiny. How is this diagnosed? Your health care provider may be able to diagnose edema by asking about your medical history and doing a physical exam. You may need to have tests such as X-rays, an electrocardiogram, or blood tests to check for medical conditions that may cause edema. How is this treated? Edema treatment depends on the cause. If you have heart, liver, or kidney disease, you need the treatment appropriate for these conditions. General treatment may include:  Elevation of the affected body part above the level of your heart.  Compression of the affected body part. Pressure from elastic bandages or support stockings squeezes the tissues and forces fluid back into the blood vessels. This keeps fluid from entering the tissues.  Restriction of fluid and salt  intake.  Use of a water pill (diuretic). These medications are appropriate only for some types of edema. They pull fluid out of your body and make you urinate more often. This gets rid of fluid and reduces swelling, but diuretics can have side effects. Only use diuretics as directed by your health care provider.  Follow these instructions at home:  Keep the affected body part above the level of your heart when you are lying down.  Do not sit still or stand for prolonged periods.  Do not put anything directly under your knees when lying down.  Do not wear constricting clothing or garters on your upper legs.  Exercise your legs to work the fluid back into your blood vessels. This may help the swelling go down.  Wear elastic bandages or support stockings to reduce ankle swelling as directed by your health care provider.  Eat a low-salt diet to reduce fluid if your health care provider recommends it.  Only take medicines as directed by your health care provider. Contact a health care provider if:  Your edema is not responding to treatment.  You have heart, liver, or kidney disease and notice symptoms of edema.  You have edema in your legs that does not improve after elevating them.  You have sudden and unexplained weight gain. Get help right away if:  You develop shortness of breath or chest pain.  You cannot breathe when you lie down.  You develop pain, redness, or warmth in the swollen areas.  You have heart, liver,  or kidney disease and suddenly get edema.  You have a fever and your symptoms suddenly get worse. This information is not intended to replace advice given to you by your health care provider. Make sure you discuss any questions you have with your health care provider. Document Released: 08/28/2005 Document Revised: 02/03/2016 Document Reviewed: 06/20/2013 Elsevier Interactive Patient Education  2017 [employer].     IF you received an x-ray today, you will  receive an invoice from [facility]. Please contact [facility] at [phone] with questions or concerns regarding your invoice.   IF you received labwork today, you will receive an invoice from [facility]. Please contact [facility] at [phone] with questions or concerns regarding your invoice.   Our billing staff will not be able to assist you with questions regarding bills from these companies.  You will be contacted with the lab results as soon as they are available. The fastest way to get your results is to activate your My Chart account. Instructions are located on the last page of this paperwork. If you have not heard from [place] regarding the results in 2 weeks, please contact this office.    '

## 2018-02-27 LAB — URIC A+ANA+RA QN+CRP+ASO
ASO: 40 IU/mL (ref 0.0–200.0)
Anti Nuclear Antibody(ANA): NEGATIVE
CRP: 6 mg/L (ref 0–10)
Rhuematoid fact SerPl-aCnc: 21.1 IU/mL — ABNORMAL HIGH (ref 0.0–13.9)
URIC ACID: 7 mg/dL (ref 2.5–7.1)

## 2018-03-01 ENCOUNTER — Encounter: Payer: Self-pay | Admitting: Urgent Care

## 2018-03-01 ENCOUNTER — Other Ambulatory Visit: Payer: Self-pay | Admitting: Urgent Care

## 2018-03-01 DIAGNOSIS — R768 Other specified abnormal immunological findings in serum: Secondary | ICD-10-CM

## 2018-03-01 DIAGNOSIS — M7989 Other specified soft tissue disorders: Secondary | ICD-10-CM

## 2018-03-01 MED ORDER — PREDNISONE 10 MG PO TABS: 30.0000 mg | ORAL_TABLET | Freq: Every day | ORAL | 0 refills | Status: DC

## 2018-03-04 NOTE — Addendum Note (Signed)
Addended by: Rogelia RohrerMCADOO, Celica Kotowski K on: 03/04/2018 08:36 AM   Modules accepted: Orders

## 2018-03-05 LAB — CYCLIC CITRUL PEPTIDE ANTIBODY, IGG/IGA: CYCLIC CITRULLIN PEPTIDE AB: 6 U (ref 0–19)

## 2018-03-12 ENCOUNTER — Telehealth: Payer: Self-pay | Admitting: Physician Assistant

## 2018-03-12 NOTE — Telephone Encounter (Unsigned)
Copied from CRM [id number]. Topic: Quick Communication - See Telephone Encounter >> Mar 12, 2018  5:13 PM Floria RavelingStovall, Shana A wrote: CRM for notification. See Telephone encounter for: 03/12/18.  Joy from [facility] called in an stated that they need xray report of right foot to be faxed over to [phone]  There phone number is [phone]

## 2018-03-14 NOTE — Telephone Encounter (Signed)
Message sent to Medical Records. Send copy of foot and ankle imaging results to Rheumatoid referral MD per message, Thanks.

## 2018-03-18 NOTE — Telephone Encounter (Signed)
I think this belongs to you guys. thanks

## 2018-03-19 NOTE — Telephone Encounter (Signed)
Done

## 2018-04-08 ENCOUNTER — Other Ambulatory Visit: Payer: Self-pay | Admitting: Rheumatology

## 2018-04-08 DIAGNOSIS — R52 Pain, unspecified: Secondary | ICD-10-CM

## 2018-04-08 DIAGNOSIS — R609 Edema, unspecified: Secondary | ICD-10-CM

## 2018-04-19 ENCOUNTER — Ambulatory Visit
Admission: RE | Admit: 2018-04-19 | Discharge: 2018-04-19 | Disposition: A | Discharge status: Home / Self Care | Payer: Managed Care, Other (non HMO) | Source: Ambulatory Visit | Attending: Rheumatology | Admitting: Rheumatology

## 2018-04-19 DIAGNOSIS — R609 Edema, unspecified: Secondary | ICD-10-CM

## 2018-04-19 DIAGNOSIS — R52 Pain, unspecified: Secondary | ICD-10-CM

## 2018-04-19 MED ORDER — GADOBENATE DIMEGLUMINE 529 MG/ML IV SOLN
15.0000 mL | Freq: Once | INTRAVENOUS | Status: AC | PRN
  Administered 2018-04-19: 15 mL via INTRAVENOUS

## 2018-04-26 ENCOUNTER — Emergency Department (HOSPITAL_COMMUNITY)
Admission: EM | Admit: 2018-04-26 | Discharge: 2018-04-26 | Disposition: A | Discharge status: Home / Self Care | Payer: Managed Care, Other (non HMO) | Attending: Emergency Medicine | Admitting: Emergency Medicine

## 2018-04-26 ENCOUNTER — Other Ambulatory Visit: Payer: Self-pay

## 2018-04-26 ENCOUNTER — Emergency Department (HOSPITAL_COMMUNITY): Payer: Managed Care, Other (non HMO)

## 2018-04-26 ENCOUNTER — Encounter (HOSPITAL_COMMUNITY): Payer: Self-pay | Admitting: Emergency Medicine

## 2018-04-26 DIAGNOSIS — R002 Palpitations: Secondary | ICD-10-CM | POA: Insufficient documentation

## 2018-04-26 DIAGNOSIS — R079 Chest pain, unspecified: Secondary | ICD-10-CM | POA: Insufficient documentation

## 2018-04-26 DIAGNOSIS — Z79899 Other long term (current) drug therapy: Secondary | ICD-10-CM | POA: Insufficient documentation

## 2018-04-26 DIAGNOSIS — R000 Tachycardia, unspecified: Secondary | ICD-10-CM | POA: Diagnosis present

## 2018-04-26 LAB — CBC
HEMATOCRIT: 41.8 % (ref 36.0–46.0)
Hemoglobin: 14 g/dL (ref 12.0–15.0)
MCH: 29.1 pg (ref 26.0–34.0)
MCHC: 33.5 g/dL (ref 30.0–36.0)
MCV: 86.9 fL (ref 78.0–100.0)
PLATELETS: 275 10*3/uL (ref 150–400)
RBC: 4.81 MIL/uL (ref 3.87–5.11)
RDW: 13.1 % (ref 11.5–15.5)
WBC: 6.7 10*3/uL (ref 4.0–10.5)

## 2018-04-26 LAB — I-STAT BETA HCG BLOOD, ED (MC, WL, AP ONLY)

## 2018-04-26 LAB — BASIC METABOLIC PANEL
Anion gap: 10 (ref 5–15)
BUN: 14 mg/dL (ref 6–20)
CHLORIDE: 108 mmol/L (ref 98–111)
CO2: 19 mmol/L — AB (ref 22–32)
CREATININE: 0.88 mg/dL (ref 0.44–1.00)
Calcium: 9 mg/dL (ref 8.9–10.3)
GFR calc Af Amer: >60 mL/min (ref >60)
GFR calc non Af Amer: >60 mL/min (ref >60)
GLUCOSE: 87 mg/dL (ref 70–99)
POTASSIUM: 4.1 mmol/L (ref 3.5–5.1)
Sodium: 137 mmol/L (ref 135–145)

## 2018-04-26 LAB — I-STAT TROPONIN, ED: Troponin i, poc: 0 ng/mL (ref 0.00–0.08)

## 2018-04-26 NOTE — ED Triage Notes (Signed)
Pt states the past 2 nights she has been awakened by "fast heart rate"  Pt states she normally runs about 80 but when she has been awakened it was in the 90s and even 101 at one point. No frank chest pain but some discomfort around her left breast. Non radiating. No shob, n/v, diaphoresis. Pt in NAD in triage.

## 2018-04-26 NOTE — ED Provider Notes (Signed)
[facility] Provider Note   CSN: [id number] Arrival date & time: 04/26/18  0457     History   Chief Complaint Chief Complaint  Patient presents with  . Chest Pain    HPI Peggy Medina is a 37 y.o. female.  37 year old female who presents with heart racing.  In the past 2 nights, the patient has had an episode of being awakened by heart racing sensation.  When she has checked her heart rate on a heart monitor app, it has been in the 90s to 101.  She denies any associated chest pain, shortness of breath, nausea, vomiting, or diaphoresis during the episodes.  They are brief and spontaneously resolved.  She reports that she occasionally has 30 sec episodes of left lateral chest wall/breast "discomfort" that is and goes randomly but has not been associated with the heart racing sensations.  She denies any fevers or recent illness.  No recent travel, leg swelling/pain, OCP use, history of blood clots, or history of cancer.  Family history negative for heart disease.  No tobacco or illicit drug use.  The history is provided by the patient.  Chest Pain      History reviewed. No pertinent past medical history.  There are no active problems to display for this patient.   Past Surgical History:  Procedure Laterality Date  . CHOLECYSTECTOMY       OB History   None      Home Medications    Prior to Admission medications   Medication Sig Start Date End Date Taking? Authorizing Provider  betamethasone dipropionate (DIPROLENE) 0.05 % ointment Apply twice daily to hand eczema. Patient taking differently: Apply 1 application topically 2 (two) times daily as needed (eczema).  11/28/17  Yes [name], [name], MD  predniSONE (DELTASONE) 10 MG tablet Take 3 tablets (30 mg total) by mouth daily with breakfast. Patient not taking: Reported on 04/26/2018 03/01/18   [name], [name], PA-C    Family History No family history on file.  Social History Social  History   Tobacco Use  . Smoking status: Never Smoker  . Smokeless tobacco: Never Used  Substance Use Topics  . Alcohol use: Yes  . Drug use: No     Allergies   Patient has no known allergies.   Review of Systems Review of Systems  Cardiovascular: Positive for chest pain.   All other systems reviewed and are negative except that which was mentioned in HPI  Physical Exam Updated Vital Signs BP 138/86   Pulse 99   Temp 98.9 F (37.2 C) (Oral)   Resp 16   Ht 5\' 8"  (1.727 m)   Wt 76.2 kg   SpO2 100%   BMI 25.54 kg/m   Physical Exam  Constitutional: She is oriented to person, place, and time. She appears well-developed and well-nourished. No distress.  HENT:  Head: Normocephalic and atraumatic.  Moist mucous membranes  Eyes: Conjunctivae are normal.  Neck: Neck supple.  Cardiovascular: Normal rate, regular rhythm and normal heart sounds.  No murmur heard. Pulmonary/Chest: Effort normal and breath sounds normal.  Abdominal: Soft. Bowel sounds are normal. She exhibits no distension. There is no tenderness.  Musculoskeletal: She exhibits no edema.  Neurological: She is alert and oriented to person, place, and time.  Fluent speech  Skin: Skin is warm and dry.  Psychiatric: She has a normal mood and affect. Judgment normal.  Nursing note and vitals reviewed.    ED Treatments / Results  Labs (all  labs ordered are listed, but only abnormal results are displayed) Labs Reviewed  BASIC METABOLIC PANEL - Abnormal; Notable for the following components:      Result Value   CO2 19 (*)    All other components within normal limits  CBC  I-STAT TROPONIN, ED  I-STAT BETA HCG BLOOD, ED ([facility], [facility], [facility] ONLY)    EKG EKG Interpretation  Date/Time:  Friday April 26 2018 05:04:07 EDT Ventricular Rate:  92 PR Interval:  150 QRS Duration: 92 QT Interval:  374 QTC Calculation: 462 R Axis:   27 Text Interpretation:  Normal sinus rhythm Incomplete right bundle branch block  Borderline ECG No previous ECGs available Confirmed by [name], [name] on 04/26/2018 7:11:42 AM Also confirmed by [name], [name] [phone]([id number]), editor Elita QuickWatlington, Beverly ([id number])  on 04/26/2018 7:56:21 AM   Radiology Dg Chest 2 View  Result Date: 04/26/2018 CLINICAL DATA:  Chest pain with palpitations since early last night. EXAM: CHEST - 2 VIEW COMPARISON:  None. FINDINGS: The heart size and mediastinal contours are within normal limits. Both lungs are clear. The visualized skeletal structures are unremarkable. IMPRESSION: No active cardiopulmonary disease. Electronically Signed   By: Burman NievesWilliam  Stevens M.D.   On: 04/26/2018 06:04    Procedures Procedures (including critical care time)  Medications Ordered in ED Medications - No data to display   Initial Impression / Assessment and Plan / ED Course  I have reviewed the triage vital signs and the nursing notes.  Pertinent labs & imaging results that were available during my care of the patient were reviewed by me and considered in my medical decision making (see chart for details).     Appearing, comfortable, and denying complaints on exam.  EKG unremarkable, shows sinus rhythm.  Vital signs normal.  No risk factors for PE and her description of isolated episodes of heart racing sensation are not suggestive of PE.  Discussed follow-up with PCP for further evaluation including thyroid studies. Regarding palpitations, contacted cardiology and the clinic will arrange for patient to have outpatient holter monitor.  Given clinic contact information.  Reviewed return precautions and patient voiced understanding. Final Clinical Impressions(s) / ED Diagnoses   Final diagnoses:  Intermittent palpitations    ED Discharge Orders    None       Aleatha Taite, Ambrose Finlandachel Morgan, MD 04/26/18 1422

## 2018-04-26 NOTE — Discharge Instructions (Addendum)
[facility] will call you to set up monitor. Return to ER if you have any sustained heart racing, new chest pain/breathing problems.

## 2018-04-30 ENCOUNTER — Other Ambulatory Visit: Payer: Self-pay | Admitting: Cardiology

## 2018-04-30 DIAGNOSIS — R002 Palpitations: Secondary | ICD-10-CM

## 2018-05-23 ENCOUNTER — Ambulatory Visit: Payer: Managed Care, Other (non HMO) | Admitting: Cardiovascular Disease

## 2018-05-27 ENCOUNTER — Ambulatory Visit (INDEPENDENT_AMBULATORY_CARE_PROVIDER_SITE_OTHER): Payer: Managed Care, Other (non HMO) | Admitting: Infectious Diseases

## 2018-05-27 ENCOUNTER — Encounter: Payer: Self-pay | Admitting: Infectious Diseases

## 2018-05-27 DIAGNOSIS — M7989 Other specified soft tissue disorders: Secondary | ICD-10-CM | POA: Insufficient documentation

## 2018-05-27 NOTE — Assessment & Plan Note (Addendum)
It is not clear to me as to what is causing her swelling. She was treated with an appropriate regimen for cellulitis without response. There is no fluctuance to the area but more a diffuse firmness at the dorsal foot with 1+ edema that is non-pitting - clinically does not appear c/w cellulitis. MRI without mention of joint/bone defect or infection and nothing drainable. ?panniculitis but less likely as this is also painful and responsive to NSAIDs. It is interesting that it is over a previously tattooed area however no recent touch ups/ink to explain recent exacerbation.   We discussed symptoms that would be concerning for infection and cellulitis and I asked her to please send a MyChart with any updates should they evolve. For now encouraged to avoid any touch up tattooing to the area until this improves or we better understand the cause. Would continue with compression ankle sleeve during the day and maybe at night for a short while to see if this improves.

## 2018-05-27 NOTE — Progress Notes (Signed)
Patient: Peggy Medina  DOB: May 01, 1981 MRN: [id number] PCP: Patient, No Pcp Per  Referring Provider: Dr. Casimer Lanius, MD Rheumatology [facility] [phone]; [phone]).   Subjective:  No chief complaint on file.  Magdaline Zollars is a 37 y.o. woman with past medical history only significant for eczema. She was referred to rheumatology office about 1 month ago in August with complaints of R foot pain and swelling x 18m slightly elevated Uric Acid @ 7 (on second draw normalized but uncertain as to timing r/t prednisone trial), low + RF @21 .1. She previously had tried naproxen without effect. Her CCP was checked and negative. She was given trial of prednisone 20mg  daily x 14d without improvement. MRI was obtained with ongoing pain revealing no concern for tenosynovitis, no abscess that would require drainage or bone infection but mentioned soft tissue swelling that was likened to cellulitis. She was given Keflex 500 mg QID x 7d and referred to ID for re-evaluation.   He has not noticed any improvement for any measure taken including compression stockings (ankle high, 15-20 mmHg). She has no fevers, chills night sweats or unintended weight loss. There is a tattoo over the forefoot that has been present for 12 years now with no new touch ups. No redness/erythema or pain at the area only swelling that sometimes worsens near ankle when she walks a lot. She has a tattoo directly under the area of swelling but no new touch ups or new ink introduced (although she has wanted a touch up); no injury to explain; no lesions present. She notices the swelling regardless to shoe type.   Review of Systems  Constitutional: Negative for chills and fever.  HENT: Negative for tinnitus.   Eyes: Negative for blurred vision and photophobia.  Respiratory: Negative for cough and sputum production.   Cardiovascular: Negative for chest pain.  Gastrointestinal: Negative for diarrhea, nausea and  vomiting.  Genitourinary: Negative for dysuria.  Skin: Negative for rash.  Neurological: Negative for headaches.    No past medical history on file.  Outpatient Medications Prior to Visit  Medication Sig Dispense Refill  . betamethasone dipropionate (DIPROLENE) 0.05 % ointment Apply twice daily to hand eczema. 30 g 0  . predniSONE (DELTASONE) 10 MG tablet Take 3 tablets (30 mg total) by mouth daily with breakfast. (Patient not taking: Reported on 04/26/2018) 15 tablet 0   No facility-administered medications prior to visit.      No Known Allergies  Social History   Tobacco Use  . Smoking status: Never Smoker  . Smokeless tobacco: Never Used  Substance Use Topics  . Alcohol use: Yes  . Drug use: No    No family history on file.  Objective:   Vitals:   05/27/18 1348  BP: 113/67  Pulse: 84  Temp: (!) 84 F (28.9 C)  TempSrc: Oral  Weight: 170 lb (77.1 kg)   Body mass index is 25.85 kg/m.  Physical Exam  Constitutional: She is oriented to person, place, and time. She appears well-developed and well-nourished.  Seated comfortably in chair.   HENT:  Mouth/Throat: Mucous membranes are normal. No oral lesions. Normal dentition. No dental abscesses. No oropharyngeal exudate.  Cardiovascular: Normal rate, regular rhythm and normal heart sounds.  Pulmonary/Chest: Effort normal and breath sounds normal.  Abdominal: Soft. She exhibits no distension. There is no tenderness.  Musculoskeletal:       Feet:  Area of firm swelling   Lymphadenopathy:    She has no cervical adenopathy.  Neurological: She is alert and oriented to person, place, and time.  Skin: Skin is warm and dry. No rash noted.  Psychiatric: She has a normal mood and affect. Judgment normal.  In good spirits today and engaged in care discussion    Lab Results: Lab Results  Component Value Date   WBC 6.7 04/26/2018   HGB 14.0 04/26/2018   HCT 41.8 04/26/2018   MCV 86.9 04/26/2018   PLT 275 04/26/2018     Lab Results  Component Value Date   CREATININE 0.88 04/26/2018   BUN 14 04/26/2018   NA 137 04/26/2018   K 4.1 04/26/2018   CL 108 04/26/2018   CO2 19 (L) 04/26/2018    Lab Results  Component Value Date   ALT 33 01/28/2018   AST 27 01/28/2018   ALKPHOS 57 01/28/2018   BILITOT 0.7 01/28/2018     Assessment & Plan:   Problem List Items Addressed This Visit      Other   Swelling of right foot    It is not clear to me as to what is causing her swelling. She was treated with an appropriate regimen for cellulitis without response. There is no fluctuance to the area but more a diffuse firmness at the dorsal foot with 1+ edema that is non-pitting - clinically does not appear c/w cellulitis. MRI without mention of joint/bone defect or infection and nothing drainable. ?panniculitis but less likely as this is also painful and responsive to NSAIDs. It is interesting that it is over a previously tattooed area however no recent touch ups/ink to explain recent exacerbation.   We discussed symptoms that would be concerning for infection and cellulitis and I asked her to please send a MyChart with any updates should they evolve. For now encouraged to avoid any touch up tattooing to the area until this improves or we better understand the cause. Would continue with compression ankle sleeve during the day and maybe at night for a short while to see if this improves.         No orders of the defined types were placed in this encounter.   Return if symptoms worsen or fail to improve.  Rexene AlbertsStephanie Julisa Flippo, MSN, NP-C [facility] Pager: [phone] Office: [phone]  05/27/18  3:28 PM

## 2018-05-27 NOTE — Patient Instructions (Signed)
Wonderful to meet you.   Like we discussed in learning what is going on we need to figure out what this is not and based on your exam and non-response to antibiotics this does not seem to be concerning for infection.  Would continue to watch the area and try compression stockings over night and during the day (with breaks) to see if any help comes from this. Standing breaks at work also may be helpful for you to encourage blood return.   If this changes at all or new symptoms concerning for infection develop (fevers, chills, night sweats, more severe swelling. MyChart message and picture uploads work too.

## 2018-05-31 ENCOUNTER — Ambulatory Visit: Payer: Managed Care, Other (non HMO) | Admitting: Cardiovascular Disease

## 2018-05-31 ENCOUNTER — Encounter: Payer: Self-pay | Admitting: Cardiovascular Disease

## 2018-05-31 VITALS — BP 122/79 | HR 80 | Ht 67.0 in | Wt 167.0 lb

## 2018-05-31 DIAGNOSIS — R002 Palpitations: Secondary | ICD-10-CM | POA: Insufficient documentation

## 2018-05-31 DIAGNOSIS — I83891 Varicose veins of right lower extremities with other complications: Secondary | ICD-10-CM

## 2018-05-31 HISTORY — DX: Palpitations: R00.2

## 2018-05-31 NOTE — Patient Instructions (Addendum)
Medication Instructions:  Your physician recommends that you continue on your current medications as directed. Please refer to the Current Medication list given to you today.  Labwork: TSH/FT3/FT4 TODAY   Testing/Procedures: Your physician has recommended that you wear a holter monitor. Holter monitors are medical devices that record the heart's electrical activity. Doctors most often use these monitors to diagnose arrhythmias. Arrhythmias are problems with the speed or rhythm of the heartbeat. The monitor is a small, portable device. You can wear one while you do your normal daily activities. This is usually used to diagnose what is causing palpitations/syncope (passing out). 48 HOUR  [facility] AT [address]  Follow-Up: Your physician recommends that you schedule a follow-up appointment in: 6 WEEKS  TRY TO EXERCISE 150 MINUTES EACH WEEK      Holter Monitoring A Holter monitor is a small device that is used to detect abnormal heart rhythms. It clips to your clothing and is connected by wires to flat, sticky disks (electrodes) that attach to your chest. It is worn continuously for 24-48 hours. Follow these instructions at home:  Wear your Holter monitor at all times, even while exercising and sleeping, for as long as directed by your health care provider.  Make sure that the Holter monitor is safely clipped to your clothing or close to your body as recommended by your health care provider.  Do not get the monitor or wires wet.  Do not put body lotion or moisturizer on your chest.  Keep your skin clean.  Keep a diary of your daily activities, such as walking and doing chores. If you feel that your heartbeat is abnormal or that your heart is fluttering or skipping a beat: ? Record what you are doing when it happens. ? Record what time of day the symptoms occur.  Return your Holter monitor as directed by your health care provider.  Keep all follow-up visits as  directed by your health care provider. This is important. Get help right away if:  You feel lightheaded or you faint.  You have trouble breathing.  You feel pain in your chest, upper arm, or jaw.  You feel sick to your stomach and your skin is pale, cool, or damp.  You heartbeat feels unusual or abnormal. This information is not intended to replace advice given to you by your health care provider. Make sure you discuss any questions you have with your health care provider. Document Released: 05/26/2004 Document Revised: 02/03/2016 Document Reviewed: 04/06/2014 Elsevier Interactive Patient Education  [employer]2018 [employer]

## 2018-05-31 NOTE — Progress Notes (Signed)
Cardiology Office Note   Date:  05/31/2018   ID:  Peggy Medina, DOB November 15, 1980, MRN [id number]  PCP:  Peggy BambergMani, [name], PA-C  Cardiologist:   Peggy Siiffany Gascoyne, MD   Chief Complaint  Patient presents with  . New Patient (Initial Visit)  . Hospitalization Follow-up    Pt states no Sx .     History of Present Illness: Peggy Medina is a 37 y.o. female who is being seen today for the evaluation of heart racing at the request of Peggy Medina, *.  She was seen in the ED 04/26/18 after awakening with hear heart racing.  Her heart rate was reportedly in the 90s-100s.  She woke up and felt like her heart was beating faster than it should.  There is no associated shortness of breath, chest pain, lightheadedness, or dizziness.  She was not feeling stressed or anxious.  She drinks up to 3 diet Pepsi's in the evening and also drinks sweet tea.  Since that time she has been avoiding caffeine and has not had any recurrent symptoms.  The episode lasted approximately 1 or 2 hours.  She notes that while connected to the telemetry monitor in the emergency department her heart rate went from 90s to 130s, though there is no mention of this in her note.  In the ED basic metabolic panel and CBC were within normal limits.  Troponin was negative.  EKG was unremarkable and she was instructed to follow-up with cardiology as an outpatient.  She does continue to have occasional heart fluttering.  The episodes typically occur at rest and last for a couple seconds.  There are no associated symptoms such as chest pain, shortness of breath, lightheadedness, or dizziness.  She has been struggling with swelling in her right foot since 12/2017.  She had an MRI that showed an infectious process.  She was treated with both steroids and an antibiotic without resolution of her symptoms.  She does not smoke or use OCPs.  She has no calf tenderness or swelling above her foot.  She denied any car rides or plane trips prior to  developing the edema.  Ms. Peggy Medina does not exercise regularly.  She mostly sits at her job at work.  She has been trying to limit carbs from her diet but has not been successful with actually losing any weight.   Past Medical History:  Diagnosis Date  . Palpitations 05/31/2018    Past Surgical History:  Procedure Laterality Date  . CHOLECYSTECTOMY       Current Outpatient Medications  Medication Sig Dispense Refill  . betamethasone dipropionate (DIPROLENE) 0.05 % ointment Apply twice daily to hand eczema. 30 g 0   No current facility-administered medications for this visit.     Allergies:   Patient has no known allergies.    Social History:  The patient  reports that she has never smoked. She has never used smokeless tobacco. She reports that she drinks alcohol. She reports that she does not use drugs.   Family History:  The patient's family history includes Breast cancer in her mother; Cancer in her maternal grandmother and paternal grandmother; Emphysema in her maternal grandfather; Heart attack (age of onset: 3550) in her paternal grandfather.    ROS:  Please see the history of present illness.   Otherwise, review of systems are positive for none.   All other systems are reviewed and negative.    PHYSICAL EXAM: VS:  BP 122/79   Pulse 80  Ht 5\' 7"  (1.702 m)   Wt 167 lb (75.8 kg)   LMP 05/02/2018 (Approximate)   BMI 26.16 kg/m  , BMI Body mass index is 26.16 kg/m. GENERAL:  Well appearing HEENT:  Pupils equal round and reactive, fundi not visualized, oral mucosa unremarkable NECK:  No jugular venous distention, waveform within normal limits, carotid upstroke brisk and symmetric, no bruits LUNGS:  Clear to auscultation bilaterally HEART:  RRR.  PMI not displaced or sustained,S1 and S2 within normal limits, no S3, no S4, no clicks, no rubs, no murmurs ABD:  Flat, positive bowel sounds normal in frequency in pitch, no bruits, no rebound, no guarding, no midline pulsatile  mass, no hepatomegaly, no splenomegaly EXT:  2 plus pulses throughout, 1+ edema on the dorsum of the R foot. no cyanosis no clubbing SKIN:  No rashes no nodules NEURO:  Cranial nerves II through XII grossly intact, motor grossly intact throughout PSYCH:  Cognitively intact, oriented to person place and time    EKG:  EKG is not ordered today. The ekg ordered 04/26/18 demonstrates sinus rhythm.  Rate 92 bpm.  Incomplete right bundle branch block.   Recent Labs: 01/28/2018: ALT 33 04/26/2018: BUN 14; Creatinine, Ser 0.88; Hemoglobin 14.0; Platelets 275; Potassium 4.1; Sodium 137    Lipid Panel No results found for: CHOL, TRIG, HDL, CHOLHDL, VLDL, LDLCALC, LDLDIRECT    Wt Readings from Last 3 Encounters:  05/31/18 167 lb (75.8 kg)  05/27/18 170 lb (77.1 kg)  04/26/18 168 lb (76.2 kg)      ASSESSMENT AND PLAN:  # Palpitations: Ms. Peggy Duverney had a night of sustained palpitations.  This seems to have improved since limiting her caffeine intake.  Blood counts and electrolytes were unremarkable.  We will check thyroid function today.  She also has occasional heart fluttering at rest that sounds more like PACs or PVCs.  We will have her wear a 48-hour Holter to better evaluate.  We discussed limiting caffeine intake.  She also drinks up to 3 alcoholic beverages daily and we suggested that she reduce this to no more than 1/day.  # R LE Edema: Given that an infectious process was found on MRI it seems that right lower extremity Dopplers are unlikely to be helpful.  She does not have any clear risk factors for DVT.  # CV Disease Prevention: Increase exercise to 150 minutes per week.  Current medicines are reviewed at length with the patient today.  The patient does not have concerns regarding medicines.  The following changes have been made:  no change  Labs/ tests ordered today include:   Orders Placed This Encounter  Procedures  . T4, free  . TSH  . T3, free  . Holter monitor - 48  hour     Disposition:   FU with Peggy Tugman C. Duke Salvia, MD, [facility] in 6 weeks.      Signed, Peggy Duda C. Duke Salvia, MD, [facility]  05/31/2018 8:41 AM    [facility]

## 2018-06-01 LAB — TSH: TSH: 1.37 u[IU]/mL (ref 0.450–4.500)

## 2018-06-01 LAB — T3, FREE: T3, Free: 2.9 pg/mL (ref 2.0–4.4)

## 2018-06-01 LAB — T4, FREE: FREE T4: 1.28 ng/dL (ref 0.82–1.77)

## 2018-06-07 ENCOUNTER — Ambulatory Visit (INDEPENDENT_AMBULATORY_CARE_PROVIDER_SITE_OTHER): Payer: Managed Care, Other (non HMO)

## 2018-06-07 DIAGNOSIS — R002 Palpitations: Secondary | ICD-10-CM

## 2018-07-17 ENCOUNTER — Ambulatory Visit: Payer: Managed Care, Other (non HMO) | Admitting: Cardiovascular Disease

## 2018-07-17 ENCOUNTER — Encounter: Payer: Self-pay | Admitting: Cardiovascular Disease

## 2018-07-17 VITALS — BP 116/82 | HR 70 | Ht 67.0 in | Wt 167.2 lb

## 2018-07-17 DIAGNOSIS — R002 Palpitations: Secondary | ICD-10-CM | POA: Diagnosis not present

## 2018-07-17 NOTE — Progress Notes (Signed)
Cardiology Office Note   Date:  07/17/2018   ID:  Peggy Medina, DOB March 27, 1981, MRN [id number]  PCP:  Wallis Bamberg, PA-C  Cardiologist:   Chilton Si, MD   No chief complaint on file.   History of Present Illness: Peggy Medina is a 37 y.o. female with palpitations here for follow up.  She was initially seen 05/2018 after she was seen in the ED 04/26/18 for palpitations.  She awakened with hear heart racing.  Her heart rate was reportedly in the 90s-100s.  She woke up and felt like her heart was beating faster than it should.  There is no associated shortness of breath, chest pain, lightheadedness, or dizziness.  At the time she was drinking up to 3 diet Pepsi's in the evening and also sweet tea.  Since that time she has been avoiding caffeine and has not had any recurrent symptoms.  At her last appointment she reported intermittent short episodes of palpitations that were very different from her the episode that occurred at night.  She wore a 48-hour Holter that did not reveal any arrhythmias.  She did not have any palpitations while wearing the monitor.  Since then she has had a couple episodes but generally has been well.  She has not expands any chest pain or shortness of breath.  She denies orthopnea or PND.  She continues to avoid caffeine.   Past Medical History:  Diagnosis Date  . Palpitations 05/31/2018    Past Surgical History:  Procedure Laterality Date  . CHOLECYSTECTOMY       Current Outpatient Medications  Medication Sig Dispense Refill  . betamethasone dipropionate (DIPROLENE) 0.05 % ointment Apply twice daily to hand eczema. 30 g 0   No current facility-administered medications for this visit.     Allergies:   Patient has no known allergies.    Social History:  The patient  reports that she has never smoked. She has never used smokeless tobacco. She reports that she drinks alcohol. She reports that she does not use drugs.   Family History:  The  patient's family history includes Breast cancer in her mother; Cancer in her maternal grandmother and paternal grandmother; Emphysema in her maternal grandfather; Heart attack (age of onset: 64) in her paternal grandfather.    ROS:  Please see the history of present illness.   Otherwise, review of systems are positive for none.   All other systems are reviewed and negative.    PHYSICAL EXAM: VS:  BP 116/82   Pulse 70   Ht 5\' 7"  (1.702 m)   Wt 167 lb 3.2 oz (75.8 kg)   BMI 26.19 kg/m  , BMI Body mass index is 26.19 kg/m. GENERAL:  Well appearing HEENT:  Pupils equal round and reactive, fundi not visualized, oral mucosa unremarkable NECK:  No jugular venous distention, waveform within normal limits, carotid upstroke brisk and symmetric, no bruits LUNGS:  Clear to auscultation bilaterally HEART:  RRR.  PMI not displaced or sustained,S1 and S2 within normal limits, no S3, no S4, no clicks, no rubs, no murmurs ABD:  Flat, positive bowel sounds normal in frequency in pitch, no bruits, no rebound, no guarding, no midline pulsatile mass, no hepatomegaly, no splenomegaly EXT:  2 plus pulses throughout, 1+ edema on the dorsum of the R foot. no cyanosis no clubbing SKIN:  No rashes no nodules NEURO:  Cranial nerves II through XII grossly intact, motor grossly intact throughout PSYCH:  Cognitively intact, oriented to person place and  time    EKG:  EKG is not ordered today. The ekg ordered 04/26/18 demonstrates sinus rhythm.  Rate 92 bpm.  Incomplete right bundle branch block.  48 Hour Holter Monitor 06/07/18:  Quality: Fair.  Baseline artifact. Predominant rhythm: sinus rhythm Average heart rate: 86 bpm Max heart rate: 163 bpm Min heart rate: 57 bpm  No arrhythmias  Recent Labs: 01/28/2018: ALT 33 04/26/2018: BUN 14; Creatinine, Ser 0.88; Hemoglobin 14.0; Platelets 275; Potassium 4.1; Sodium 137 05/31/2018: TSH 1.370    Lipid Panel No results found for: CHOL, TRIG, HDL, CHOLHDL, VLDL,  LDLCALC, LDLDIRECT    Wt Readings from Last 3 Encounters:  07/17/18 167 lb 3.2 oz (75.8 kg)  05/31/18 167 lb (75.8 kg)  05/27/18 170 lb (77.1 kg)      ASSESSMENT AND PLAN:  # Palpitations: Stable.  It seems that she still has some PACs or PVCs but no sustained arrhythmias.  If she starts having more frequent symptoms we can have her wear heart monitor again.  I also discussed her getting the [employer] app or watch that is capable of taking EKG strips.  She could also go to the fire department or call our office to have an EKG she feels her symptoms are ongoing.  Laboratory testing has been unremarkable.  # CV Disease Prevention: Increase exercise to 150 minutes per week.  Current medicines are reviewed at length with the patient today.  The patient does not have concerns regarding medicines.  The following changes have been made:  no change  Labs/ tests ordered today include:   No orders of the defined types were placed in this encounter.    Disposition:   FU with Lexander Tremblay C. Duke Salvia, MD, [facility] as needed.    Signed, Jackolyn Geron C. Duke Salvia, MD, [facility]  07/17/2018 4:13 PM     [facility]

## 2018-07-17 NOTE — Patient Instructions (Signed)
Medication Instructions:  NO CHANGE  Labwork: NONE   Testing/Procedures: NONE  Follow-Up: AS NEEDED     

## 2019-12-24 IMAGING — DX DG ANKLE COMPLETE 3+V*R*
3 series · 3 of 3 positions shown · non-contrast
Comparison: RIGHT foot radiograph January 14, 2018

CLINICAL DATA: Pain and swelling, swelling for over a month.

EXAM:
RIGHT ANKLE - COMPLETE 3+ VIEW

[ankle ap]
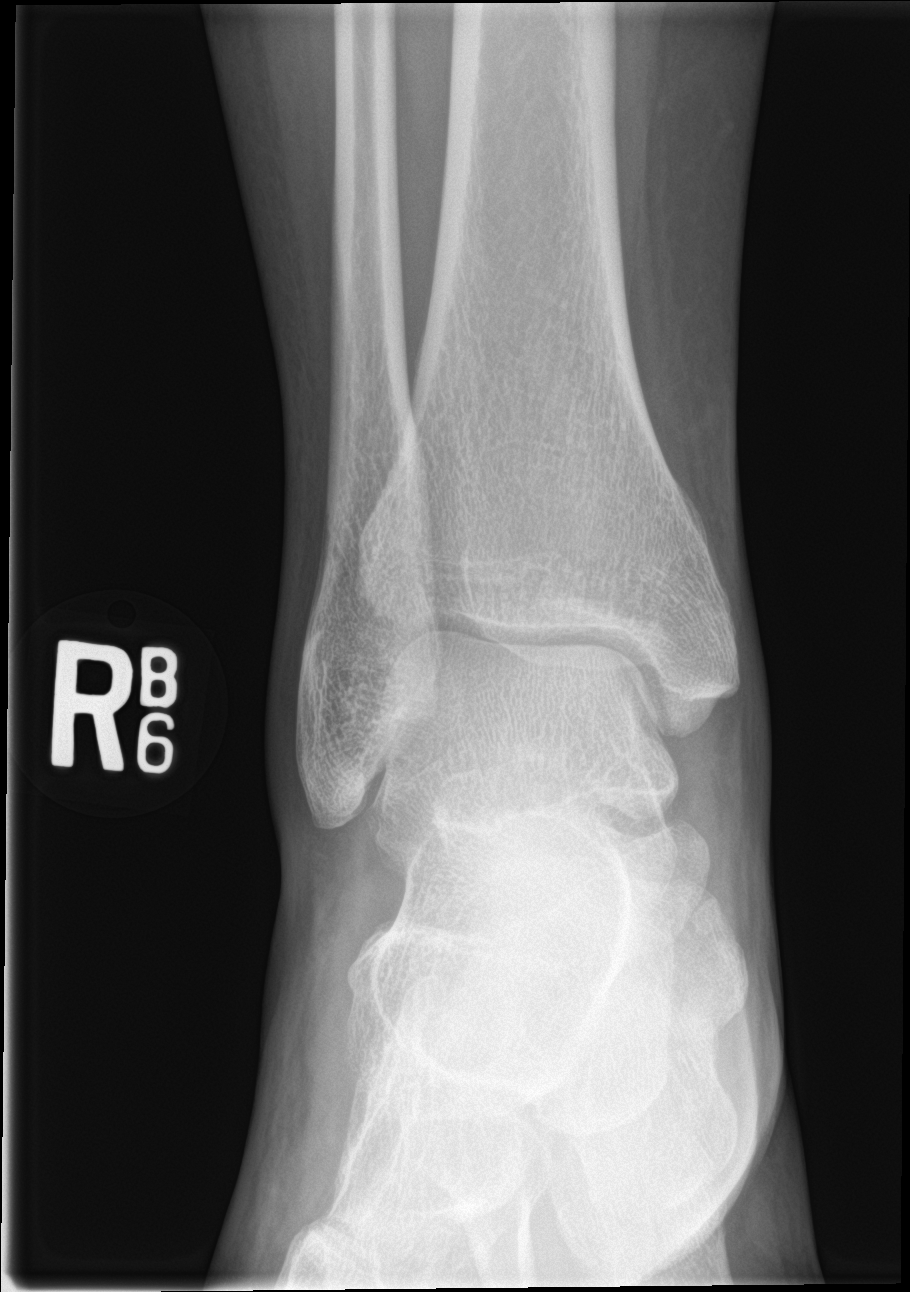

[ankle obl]
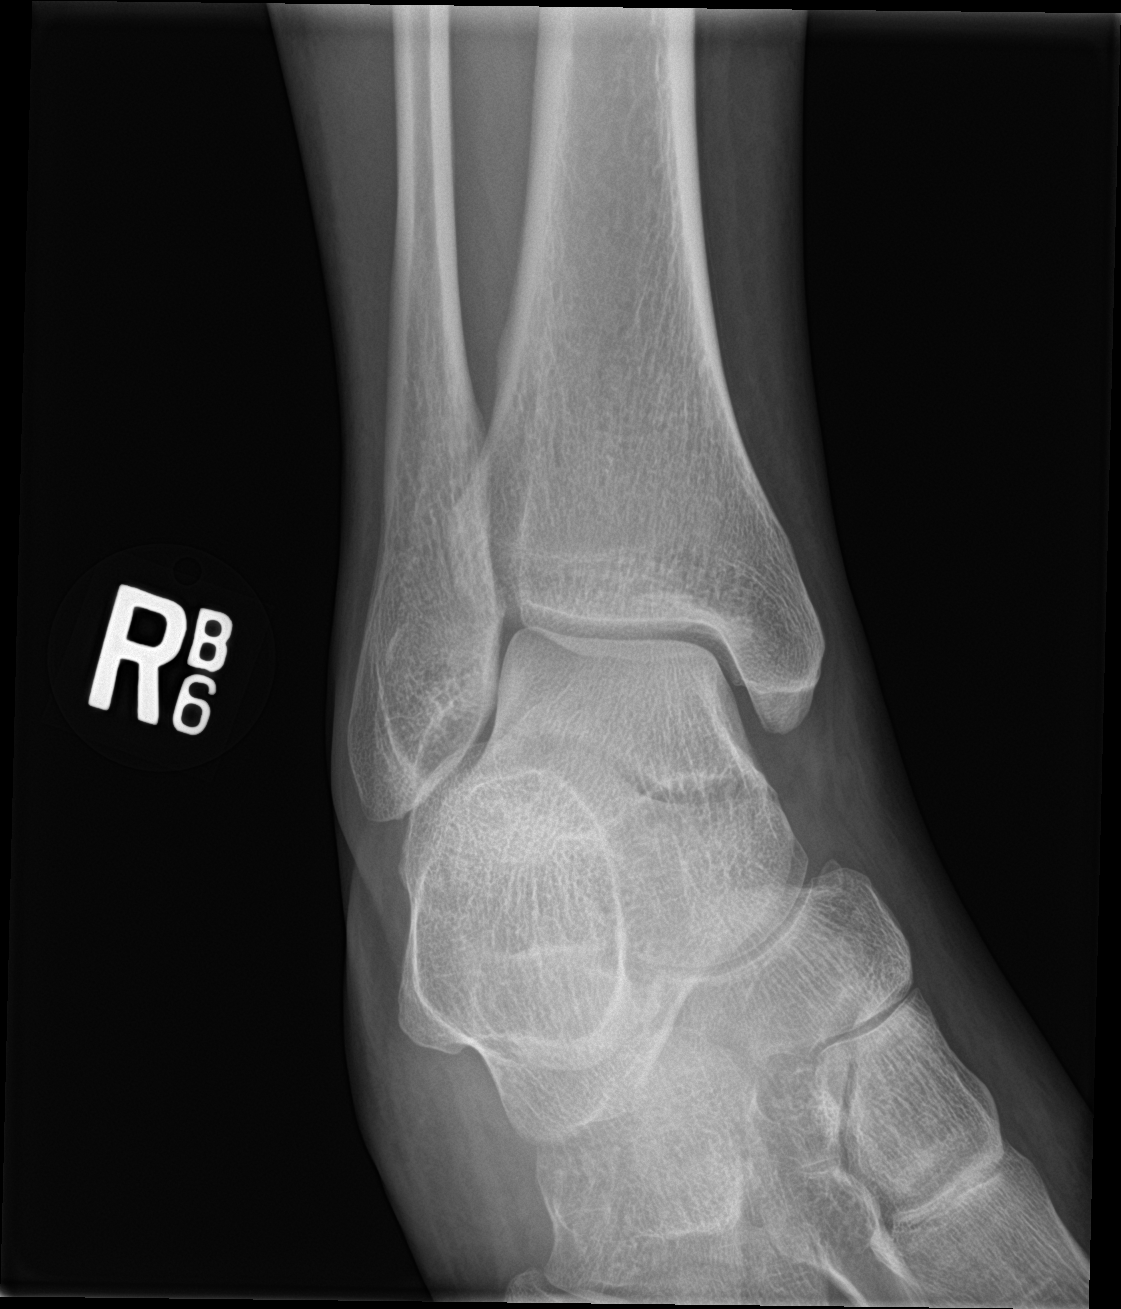

[ankle lat]
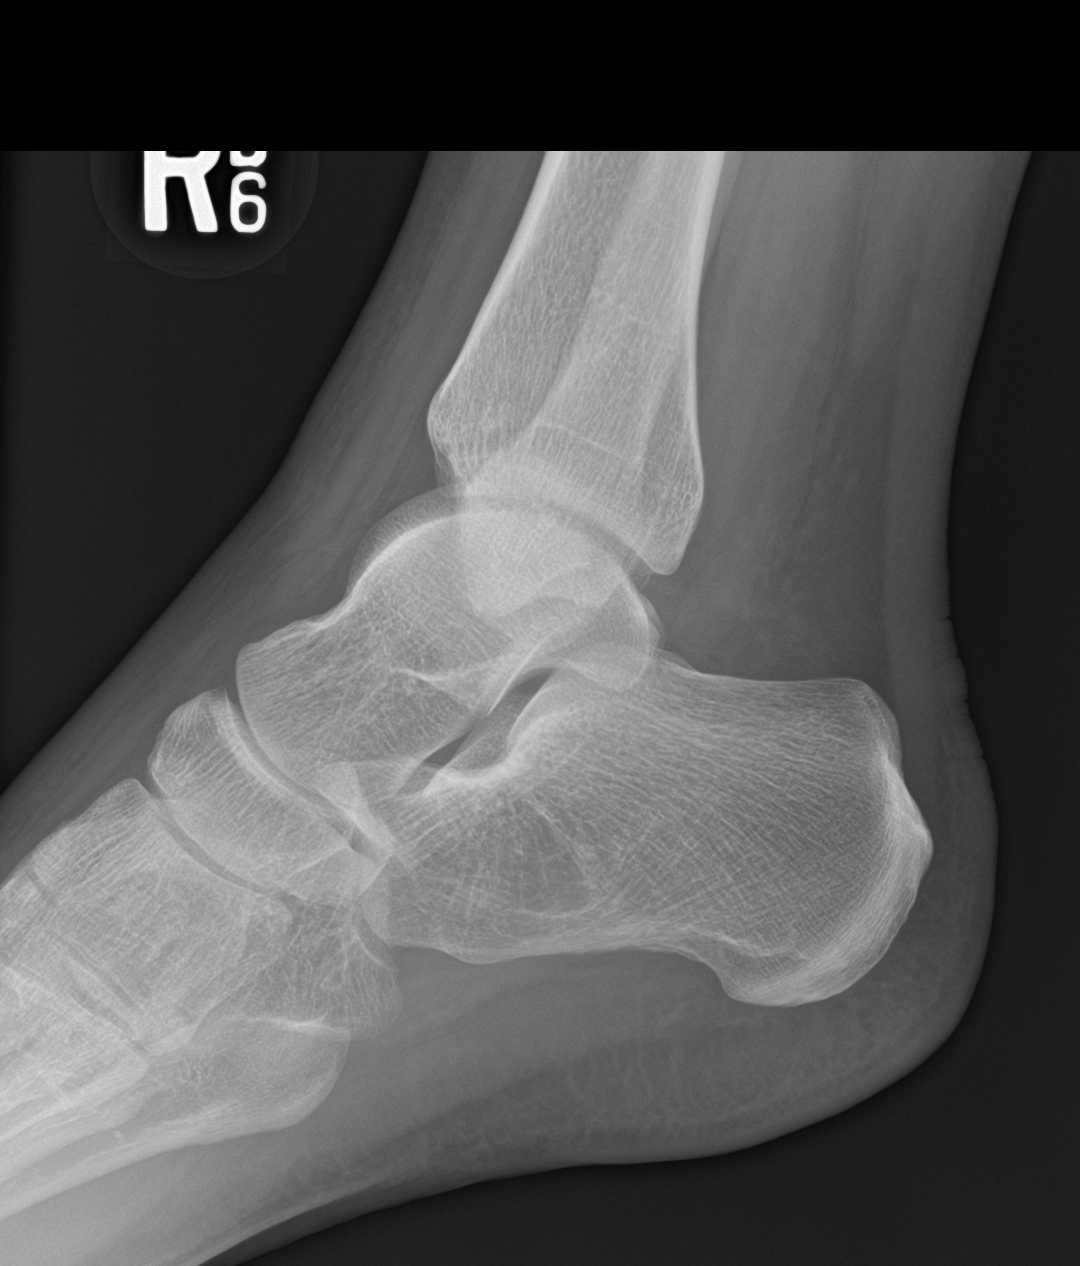

[3 of 3 positions shown; findings below may reference images not displayed]

FINDINGS: No fracture deformity nor dislocation. The ankle mortise appears
congruent and the tibiofibular syndesmosis intact. No destructive
bony lesions. Mild soft tissue swelling without subcutaneous gas or
radiopaque foreign bodies.
IMPRESSION: Soft tissue swelling.  No acute osseous process.

## 2020-05-07 ENCOUNTER — Ambulatory Visit (INDEPENDENT_AMBULATORY_CARE_PROVIDER_SITE_OTHER): Payer: Managed Care, Other (non HMO)

## 2020-05-07 ENCOUNTER — Other Ambulatory Visit: Payer: Self-pay

## 2020-05-07 ENCOUNTER — Ambulatory Visit: Payer: Managed Care, Other (non HMO) | Admitting: Podiatry

## 2020-05-07 ENCOUNTER — Encounter: Payer: Self-pay | Admitting: Podiatry

## 2020-05-07 DIAGNOSIS — R600 Localized edema: Secondary | ICD-10-CM

## 2020-05-07 DIAGNOSIS — M25476 Effusion, unspecified foot: Secondary | ICD-10-CM

## 2020-05-07 DIAGNOSIS — M778 Other enthesopathies, not elsewhere classified: Secondary | ICD-10-CM | POA: Diagnosis not present

## 2020-05-07 DIAGNOSIS — M779 Enthesopathy, unspecified: Secondary | ICD-10-CM | POA: Diagnosis not present

## 2020-05-10 NOTE — Progress Notes (Signed)
Subjective:   Patient ID: Peggy Medina, female   DOB: 39 y.o.   MRN: [id number]   HPI Patient presents stating she has had chronic swelling in her foot and has been to a number of other doctors without relief of symptoms. States that she tries elevation and states that nothing seems to make a difference. Patient does not smoke likes to be active   Review of Systems  All other systems reviewed and are negative.       Objective:  Physical Exam Vitals and nursing note reviewed.  Constitutional:      Appearance: She is well-developed.  Pulmonary:     Effort: Pulmonary effort is normal.  Musculoskeletal:        General: Normal range of motion.  Skin:    General: Skin is warm.  Neurological:     Mental Status: She is alert.     Neurovascular status intact muscle strength found to be adequate range of motion within normal limits. Patient is noted to have moderate swelling in the forefoot right with no ankle or lower leg involvement with negative Denna Haggard' sign noted. There is mild discomfort when the swelling reaches a certain point and patient did have good digital perfusion well oriented x3     Assessment:  Probability that this is lymphatic swelling versus venous swelling and most likely hereditary     Plan:  H&P education concerning condition given to patient. At this point I have recommended compression stocking and I reviewed compression stocking along with elevation. She could see a vascular doctor but I do not think we will be able to do anything to cure this problem right now it is low-grade and hopefully will not get worse  X-ray was negative for signs that there is any kind of arthritis stress fracture or bone issue which could be creating her symptoms

## 2020-05-13 ENCOUNTER — Other Ambulatory Visit: Payer: Self-pay | Admitting: Podiatry

## 2020-05-13 DIAGNOSIS — M25476 Effusion, unspecified foot: Secondary | ICD-10-CM

## 2021-07-13 ENCOUNTER — Other Ambulatory Visit: Payer: Self-pay | Admitting: Obstetrics and Gynecology

## 2021-07-13 ENCOUNTER — Other Ambulatory Visit: Payer: Self-pay | Admitting: Urgent Care

## 2021-07-13 DIAGNOSIS — Z1231 Encounter for screening mammogram for malignant neoplasm of breast: Secondary | ICD-10-CM

## 2021-08-17 ENCOUNTER — Ambulatory Visit
Admission: RE | Admit: 2021-08-17 | Discharge: 2021-08-17 | Disposition: A | Discharge status: Home / Self Care | Payer: Managed Care, Other (non HMO) | Source: Ambulatory Visit | Attending: Obstetrics and Gynecology | Admitting: Obstetrics and Gynecology

## 2021-08-17 DIAGNOSIS — Z1231 Encounter for screening mammogram for malignant neoplasm of breast: Secondary | ICD-10-CM

## 2021-08-19 ENCOUNTER — Other Ambulatory Visit: Payer: Self-pay | Admitting: Obstetrics and Gynecology

## 2021-08-19 DIAGNOSIS — R928 Other abnormal and inconclusive findings on diagnostic imaging of breast: Secondary | ICD-10-CM

## 2021-09-22 ENCOUNTER — Ambulatory Visit
Admission: RE | Admit: 2021-09-22 | Discharge: 2021-09-22 | Disposition: A | Discharge status: Home / Self Care | Payer: Managed Care, Other (non HMO) | Source: Ambulatory Visit | Attending: Obstetrics and Gynecology | Admitting: Obstetrics and Gynecology

## 2021-09-22 ENCOUNTER — Other Ambulatory Visit: Payer: Self-pay | Admitting: Obstetrics and Gynecology

## 2021-09-22 DIAGNOSIS — R928 Other abnormal and inconclusive findings on diagnostic imaging of breast: Secondary | ICD-10-CM

## 2022-04-26 ENCOUNTER — Other Ambulatory Visit: Payer: Self-pay | Admitting: Obstetrics and Gynecology

## 2022-04-26 ENCOUNTER — Ambulatory Visit
Admission: RE | Admit: 2022-04-26 | Discharge: 2022-04-26 | Disposition: A | Discharge status: Home / Self Care | Payer: Managed Care, Other (non HMO) | Source: Ambulatory Visit | Attending: Obstetrics and Gynecology | Admitting: Obstetrics and Gynecology

## 2022-04-26 DIAGNOSIS — R928 Other abnormal and inconclusive findings on diagnostic imaging of breast: Secondary | ICD-10-CM

## 2022-08-18 ENCOUNTER — Ambulatory Visit
Admission: RE | Admit: 2022-08-18 | Discharge: 2022-08-18 | Disposition: A | Discharge status: Home / Self Care | Payer: Managed Care, Other (non HMO) | Source: Ambulatory Visit | Attending: Obstetrics and Gynecology | Admitting: Obstetrics and Gynecology

## 2022-08-18 DIAGNOSIS — R928 Other abnormal and inconclusive findings on diagnostic imaging of breast: Secondary | ICD-10-CM

## 2023-07-23 ENCOUNTER — Other Ambulatory Visit: Payer: Self-pay | Admitting: Obstetrics and Gynecology

## 2023-07-23 DIAGNOSIS — Z0000 Encounter for general adult medical examination without abnormal findings: Secondary | ICD-10-CM

## 2023-07-23 DIAGNOSIS — R921 Mammographic calcification found on diagnostic imaging of breast: Secondary | ICD-10-CM

## 2023-07-26 IMAGING — MG MM DIGITAL DIAGNOSTIC UNILAT*R*
4 series · 4 of 8 positions shown · non-contrast
Comparison: Previous exam(s).

CLINICAL DATA: Patient recalled from screening for right breast
calcifications.

EXAM:
DIGITAL DIAGNOSTIC UNILATERAL RIGHT MAMMOGRAM
TECHNIQUE: Right digital diagnostic mammography was performed. Mammographic
images were processed with CAD.

[R ML]
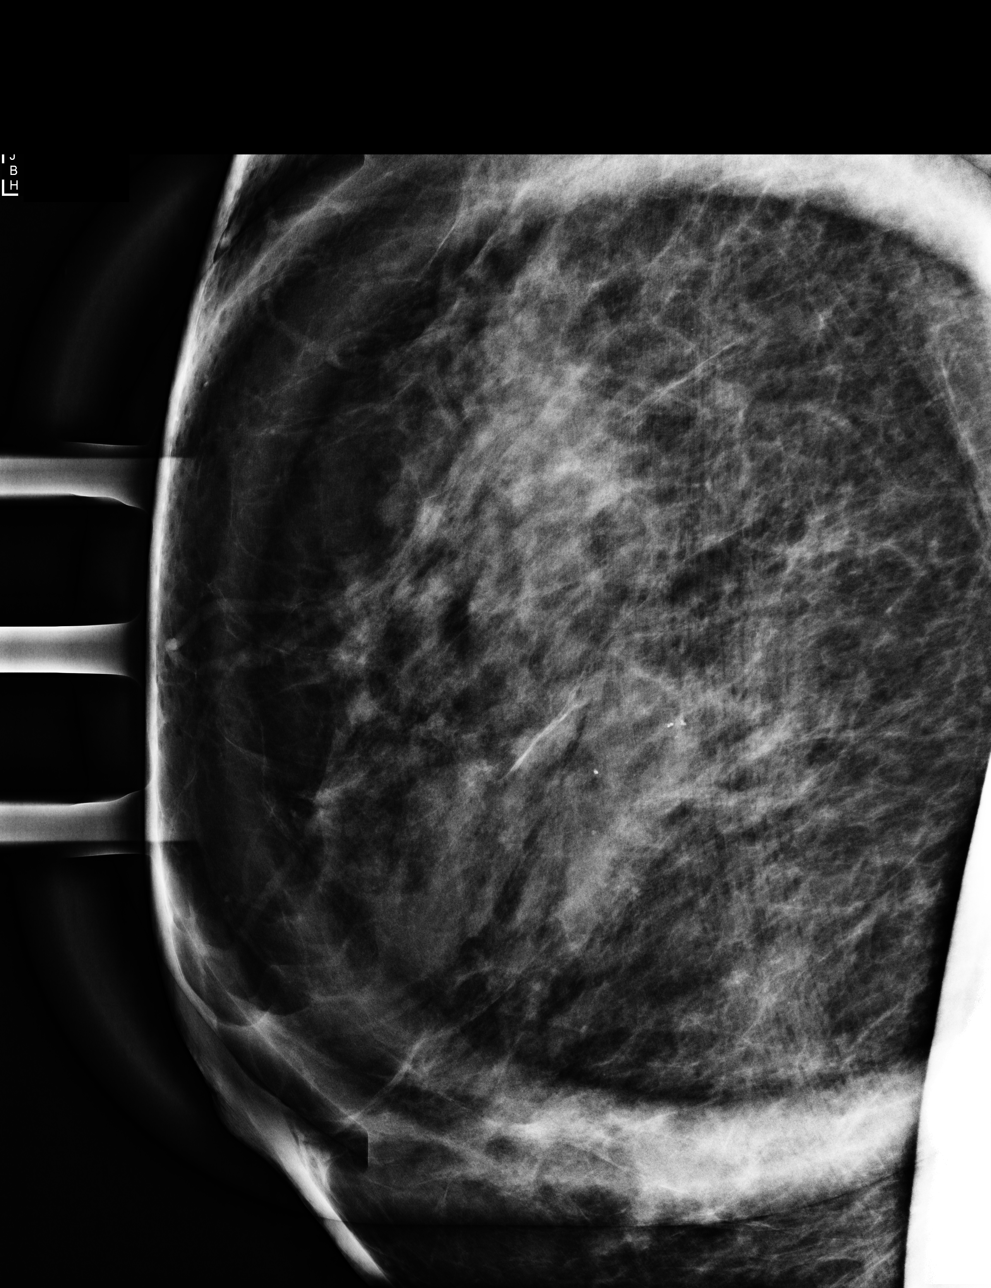

[R CC]
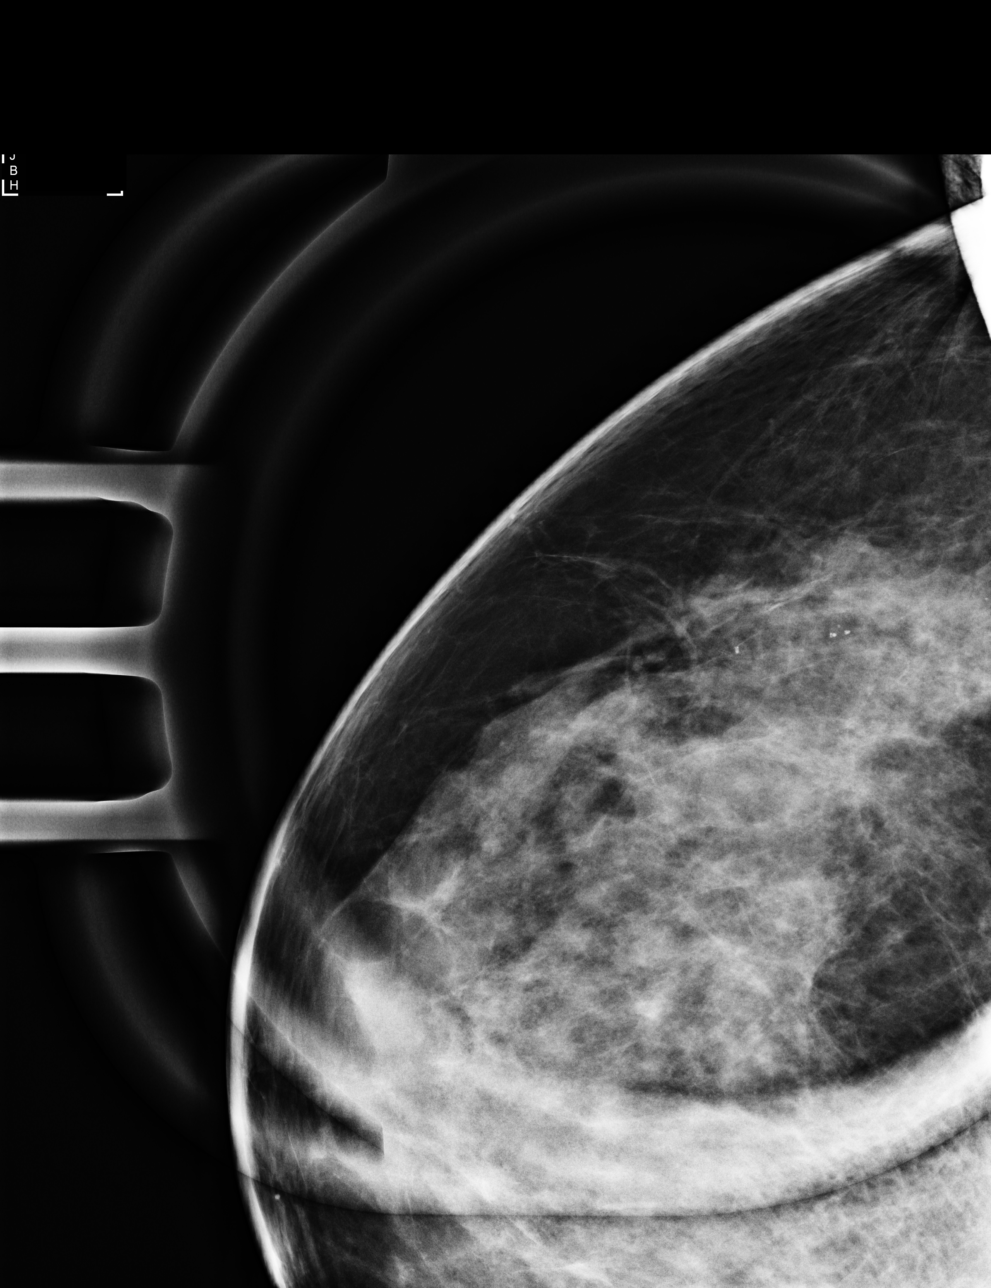

[R ML synth-2D]
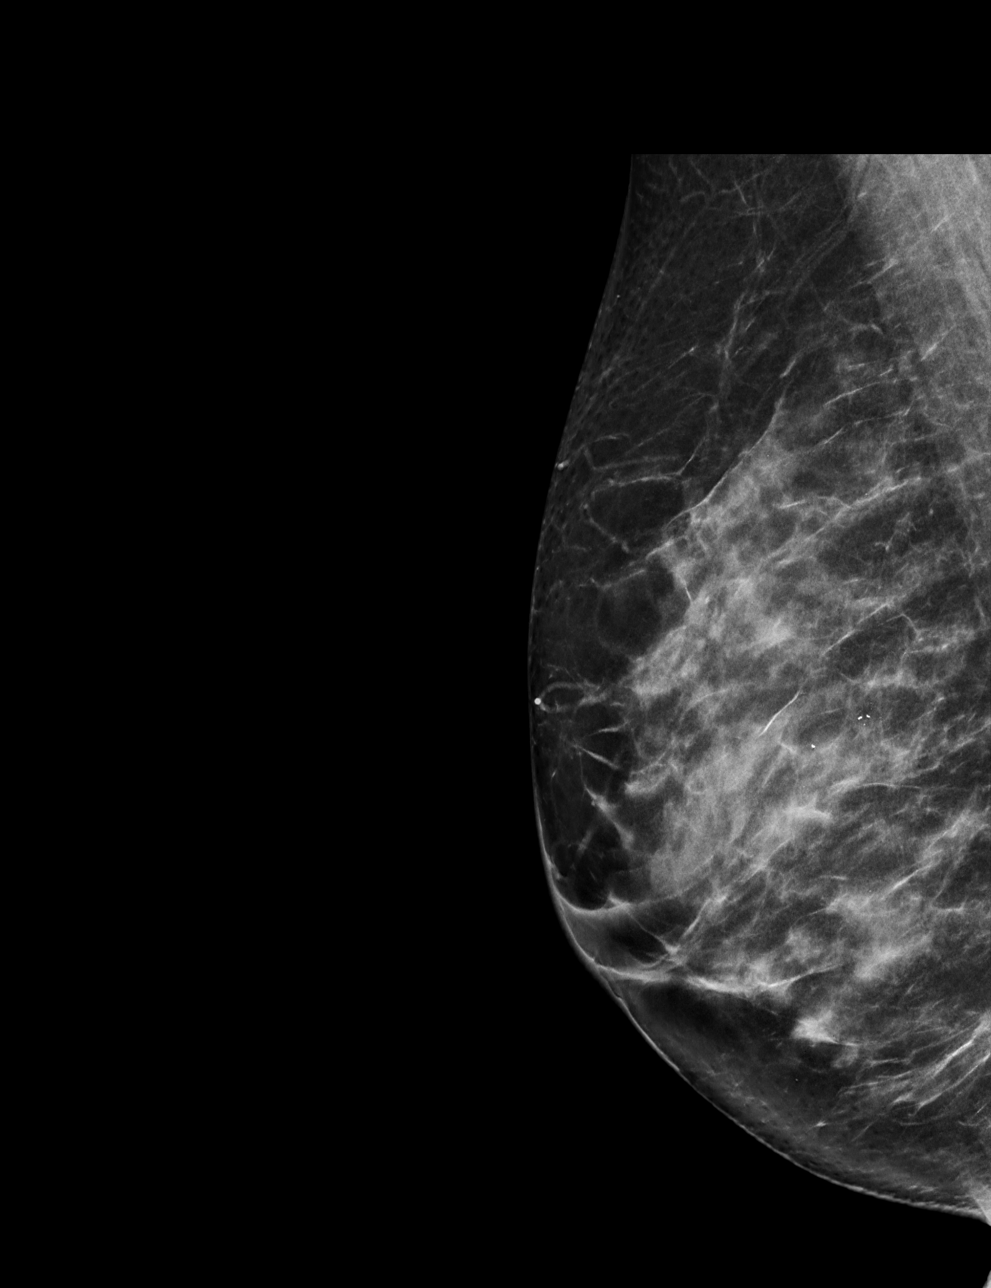

[R ML tomo · tomo slice 35/70.0]
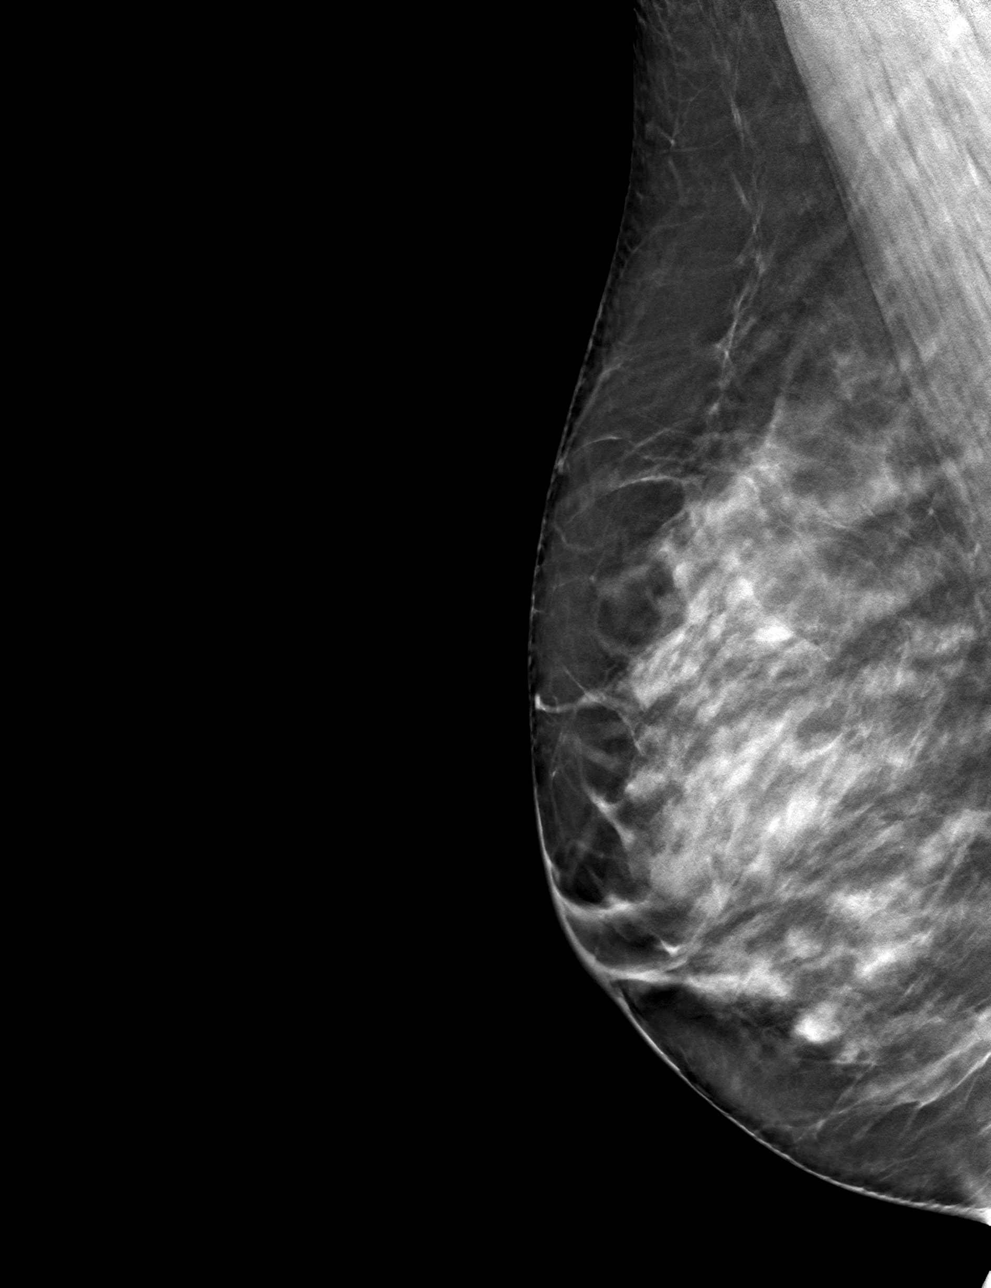

[4 of 8 positions shown; findings below may reference images not displayed]

ACR Breast Density Category c: The breast tissue is heterogeneously
dense, which may obscure small masses.
FINDINGS: Magnification views of the right breast were obtained. Within the
outer right breast there are loosely grouped likely early dystrophic
calcifications, further evaluated with Mag views.
IMPRESSION: Probably benign right breast calcifications, likely early
dystrophic.

RECOMMENDATION:
Right breast diagnostic mammogram with magnification views in 6
months.

I have discussed the findings and recommendations with the patient.
If applicable, a reminder letter will be sent to the patient
regarding the next appointment.

BI-RADS CATEGORY  3: Probably benign.

## 2023-08-20 ENCOUNTER — Ambulatory Visit
Admission: RE | Admit: 2023-08-20 | Discharge: 2023-08-20 | Disposition: A | Discharge status: Home / Self Care | Payer: Managed Care, Other (non HMO) | Source: Ambulatory Visit | Attending: Obstetrics and Gynecology | Admitting: Obstetrics and Gynecology

## 2023-08-20 DIAGNOSIS — R921 Mammographic calcification found on diagnostic imaging of breast: Secondary | ICD-10-CM

## 2024-02-28 ENCOUNTER — Ambulatory Visit (HOSPITAL_COMMUNITY)
Admission: EM | Admit: 2024-02-28 | Discharge: 2024-02-28 | Disposition: A | Discharge status: Home / Self Care | Attending: Emergency Medicine | Admitting: Emergency Medicine

## 2024-02-28 ENCOUNTER — Encounter (HOSPITAL_COMMUNITY): Payer: Self-pay | Admitting: *Deleted

## 2024-02-28 DIAGNOSIS — R002 Palpitations: Secondary | ICD-10-CM | POA: Diagnosis present

## 2024-02-28 DIAGNOSIS — R030 Elevated blood-pressure reading, without diagnosis of hypertension: Secondary | ICD-10-CM | POA: Diagnosis present

## 2024-02-28 LAB — CBC WITH DIFFERENTIAL/PLATELET
Abs Immature Granulocytes: 0.03 10*3/uL (ref 0.00–0.07)
Basophils Absolute: 0 10*3/uL (ref 0.0–0.1)
Basophils Relative: 0 %
Eosinophils Absolute: 0.1 10*3/uL (ref 0.0–0.5)
Eosinophils Relative: 1 %
HCT: 39.6 % (ref 36.0–46.0)
Hemoglobin: 13.7 g/dL (ref 12.0–15.0)
Immature Granulocytes: 0 %
Lymphocytes Relative: 25 %
Lymphs Abs: 1.9 10*3/uL (ref 0.7–4.0)
MCH: 29.8 pg (ref 26.0–34.0)
MCHC: 34.6 g/dL (ref 30.0–36.0)
MCV: 86.1 fL (ref 80.0–100.0)
Monocytes Absolute: 0.6 10*3/uL (ref 0.1–1.0)
Monocytes Relative: 8 %
Neutro Abs: 4.7 10*3/uL (ref 1.7–7.7)
Neutrophils Relative %: 66 %
Platelets: 346 10*3/uL (ref 150–400)
RBC: 4.6 MIL/uL (ref 3.87–5.11)
RDW: 13.6 % (ref 11.5–15.5)
WBC: 7.3 10*3/uL (ref 4.0–10.5)
nRBC: 0 % (ref 0.0–0.2)

## 2024-02-28 LAB — COMPREHENSIVE METABOLIC PANEL WITH GFR
ALT: 39 U/L (ref 0–44)
AST: 36 U/L (ref 15–41)
Albumin: 4.1 g/dL (ref 3.5–5.0)
Alkaline Phosphatase: 51 U/L (ref 38–126)
Anion gap: 9 (ref 5–15)
BUN: 13 mg/dL (ref 6–20)
CO2: 27 mmol/L (ref 22–32)
Calcium: 10 mg/dL (ref 8.9–10.3)
Chloride: 101 mmol/L (ref 98–111)
Creatinine, Ser: 0.62 mg/dL (ref 0.44–1.00)
GFR, Estimated: >60 mL/min (ref >60)
Glucose, Bld: 98 mg/dL (ref 70–99)
Potassium: 4.4 mmol/L (ref 3.5–5.1)
Sodium: 137 mmol/L (ref 135–145)
Total Bilirubin: 0.4 mg/dL (ref 0.0–1.2)
Total Protein: 7.5 g/dL (ref 6.5–8.1)

## 2024-02-28 NOTE — ED Triage Notes (Signed)
 Pt states she has noticed since last night that her blood pressure has been , she took at home twice and states it was high so she came in today to get checked ,  denies pain, no headache or chest pain,  doesn't feel normal but can't explain.

## 2024-02-28 NOTE — Discharge Instructions (Signed)
 Your EKG did not show any acute cardiac abnormality.  Your blood pressure improved on recheck.  This may be related to stress.  We have checked some basic labs and you will be contacted if anything requires urgent follow-up.  Please follow-up with your primary care provider for further evaluation if this continues.  Seek immediate care for any chest pain or shortness of breath.

## 2024-02-28 NOTE — ED Provider Notes (Signed)
 [facility]    CSN: [id number] Arrival date & time: 02/28/24  1653      History   Chief Complaint Chief Complaint  Patient presents with   Hypertension    HPI Peggy Medina is a 43 y.o. female.   Patient presents to clinic over concern of elevated heart rate, palpitations and hypertension that started last night.  She woke from sleep, felt like she had a bad dream and felt like her heart was beating faster than normal.  When she checked her heart rate it was in the 80s, normally it is 60s to 70s.  She also checked her blood pressure which was elevated in the 140s or 150s.  Had checked her blood pressure throughout today and noticed that her heart rate was still elevated from her baseline and her blood pressure was still high.  Has not had any cough, wheezing, shortness of breath or chest pain.  Has not had any more palpitations since waking up last night in the middle of the night.  She does not quite 'feel right,' but is unable to quantify or explain her feelings.  Reports more stress than usual over financials.  Has not changed her diet or caffeine intake recently.  Has a primary care provider that she saw in January.  Has not had recent labs.  History of palpitations, diagnosed in 2019.  Was seen by cardiology in 2019 and wore a 48-hour Holter monitor that did not show any sustained arrhythmias.  The history is provided by the patient and medical records.  Hypertension    Past Medical History:  Diagnosis Date   Palpitations 05/31/2018    Patient Active Problem List   Diagnosis Date Noted   Palpitations 05/31/2018   Swelling of right foot 05/27/2018    Past Surgical History:  Procedure Laterality Date   CHOLECYSTECTOMY      OB History   No obstetric history on file.      Home Medications    Prior to Admission medications   Medication Sig Start Date End Date Taking? Authorizing Provider  betamethasone  dipropionate (DIPROLENE ) 0.05 %  ointment Apply twice daily to hand eczema. 11/28/17   Afton Albright, MD    Family History Family History  Problem Relation Age of Onset   Breast cancer Mother    Cancer Maternal Grandmother    Emphysema Maternal Grandfather    Cancer Paternal Grandmother    Heart attack Paternal Grandfather 16    Social History Social History   Tobacco Use   Smoking status: Never   Smokeless tobacco: Never  Substance Use Topics   Alcohol use: Yes   Drug use: No     Allergies   Patient has no known allergies.   Review of Systems Review of Systems  Per HPI  Physical Exam Triage Vital Signs ED Triage Vitals  Encounter Vitals Group     BP 02/28/24 1805 (!) 180/95     Girls Systolic BP Percentile --      Girls Diastolic BP Percentile --      Boys Systolic BP Percentile --      Boys Diastolic BP Percentile --      Pulse Rate 02/28/24 1805 78     Resp 02/28/24 1805 16     Temp 02/28/24 1805 97.8 F (36.6 C)     Temp Source 02/28/24 1805 Oral     SpO2 02/28/24 1805 96 %     Weight --      Height --  Head Circumference --      Peak Flow --      Pain Score 02/28/24 1806 0     Pain Loc --      Pain Education --      Exclude from Growth Chart --    No data found.  Updated Vital Signs BP (!) 134/95 (BP Location: Left Arm)   Pulse 78   Temp 97.8 F (36.6 C) (Oral)   Resp 16   SpO2 96%   Visual Acuity Right Eye Distance:   Left Eye Distance:   Bilateral Distance:    Right Eye Near:   Left Eye Near:    Bilateral Near:     Physical Exam Vitals and nursing note reviewed.  Constitutional:      Appearance: Normal appearance.  HENT:     Head: Normocephalic and atraumatic.     Right Ear: External ear normal.     Left Ear: External ear normal.     Nose: Nose normal.     Mouth/Throat:     Mouth: Mucous membranes are moist.   Eyes:     Conjunctiva/sclera: Conjunctivae normal.    Cardiovascular:     Rate and Rhythm: Normal rate and regular rhythm.     Heart  sounds: Normal heart sounds. No murmur heard. Pulmonary:     Effort: Pulmonary effort is normal. No respiratory distress.     Breath sounds: Normal breath sounds.   Skin:    General: Skin is warm and dry.   Neurological:     General: No focal deficit present.     Mental Status: She is alert.   Psychiatric:        Mood and Affect: Mood normal.      UC Treatments / Results  Labs (all labs ordered are listed, but only abnormal results are displayed) Labs Reviewed  CBC WITH DIFFERENTIAL/PLATELET  COMPREHENSIVE METABOLIC PANEL WITH GFR    EKG   Radiology No results found.  Procedures Procedures (including critical care time)  Medications Ordered in UC Medications - No data to display  Initial Impression / Assessment and Plan / UC Course  I have reviewed the triage vital signs and the nursing notes.  Pertinent labs & imaging results that were available during my care of the patient were reviewed by me and considered in my medical decision making (see chart for details).  Vitals in triage reviewed, patient is hemodynamically stable.  Blood pressure improved on recheck.  Heart with regular rate and rhythm, lungs vesicular.  Without current palpitations.  Denies chest pain.  EKG shows normal sinus rhythm at a rate of 73 bpm no ST elevation or ST depression.  Comparable with previous.  Will check basic labs.  Does not appear to be emergent cardiac etiology at this time.  Encouraged PCP follow-up and strict emergency precautions if symptoms evolve.  Plan of care, follow-up care return precautions given, no questions at this time.    Final Clinical Impressions(s) / UC Diagnoses   Final diagnoses:  Elevated blood pressure reading  Palpitations     Discharge Instructions      Your EKG did not show any acute cardiac abnormality.  Your blood pressure improved on recheck.  This may be related to stress.  We have checked some basic labs and you will be contacted if  anything requires urgent follow-up.  Please follow-up with your primary care provider for further evaluation if this continues.  Seek immediate care for any chest pain or shortness  of breath.     ED Prescriptions   None    PDMP not reviewed this encounter.   Harlow Lighter, Demone Lyles  N, FNP 02/28/24 1856

## 2024-02-29 ENCOUNTER — Ambulatory Visit (HOSPITAL_COMMUNITY): Payer: Self-pay

## 2024-07-16 ENCOUNTER — Other Ambulatory Visit: Payer: Self-pay | Admitting: Internal Medicine

## 2024-07-16 DIAGNOSIS — Z1231 Encounter for screening mammogram for malignant neoplasm of breast: Secondary | ICD-10-CM

## 2024-08-20 ENCOUNTER — Ambulatory Visit
Admission: RE | Admit: 2024-08-20 | Discharge: 2024-08-20 | Disposition: A | Discharge status: Home / Self Care | Source: Ambulatory Visit | Attending: Internal Medicine | Admitting: Internal Medicine

## 2024-08-20 DIAGNOSIS — Z1231 Encounter for screening mammogram for malignant neoplasm of breast: Secondary | ICD-10-CM
